# Patient Record
Sex: Female | Born: 1975
Health system: Midwestern US, Community
[De-identification: ages and names within clinical notes are randomized; demographics above are authoritative.]

## PROBLEM LIST (undated history)

## (undated) DIAGNOSIS — F329 Major depressive disorder, single episode, unspecified: Secondary | ICD-10-CM

## (undated) DIAGNOSIS — F4310 Post-traumatic stress disorder, unspecified: Secondary | ICD-10-CM

## (undated) DIAGNOSIS — M797 Fibromyalgia: Secondary | ICD-10-CM

## (undated) DIAGNOSIS — F319 Bipolar disorder, unspecified: Secondary | ICD-10-CM

## (undated) DIAGNOSIS — G43909 Migraine, unspecified, not intractable, without status migrainosus: Secondary | ICD-10-CM

## (undated) DIAGNOSIS — F32A Depression, unspecified: Secondary | ICD-10-CM

## (undated) DIAGNOSIS — E079 Disorder of thyroid, unspecified: Secondary | ICD-10-CM

## (undated) HISTORY — PX: BRAIN SURGERY: SHX531

## (undated) HISTORY — PX: TONSILLECTOMY: SUR1361

---

## 1997-10-25 ENCOUNTER — Emergency Department (HOSPITAL_COMMUNITY): Admission: EM | Admit: 1997-10-25 | Discharge: 1997-10-25 | Payer: Self-pay | Admitting: Emergency Medicine

## 2005-07-22 ENCOUNTER — Emergency Department (HOSPITAL_COMMUNITY): Admission: EM | Admit: 2005-07-22 | Discharge: 2005-07-22 | Payer: Self-pay | Admitting: Emergency Medicine

## 2006-02-12 ENCOUNTER — Emergency Department (HOSPITAL_COMMUNITY): Admission: EM | Admit: 2006-02-12 | Discharge: 2006-02-12 | Payer: Self-pay | Admitting: Emergency Medicine

## 2006-03-02 ENCOUNTER — Emergency Department (HOSPITAL_COMMUNITY): Admission: EM | Admit: 2006-03-02 | Discharge: 2006-03-02 | Payer: Self-pay | Admitting: Emergency Medicine

## 2006-12-30 ENCOUNTER — Emergency Department (HOSPITAL_COMMUNITY): Admission: EM | Admit: 2006-12-30 | Discharge: 2006-12-30 | Payer: Self-pay | Admitting: Emergency Medicine

## 2007-01-08 ENCOUNTER — Emergency Department (HOSPITAL_COMMUNITY): Admission: EM | Admit: 2007-01-08 | Discharge: 2007-01-08 | Payer: Self-pay | Admitting: Emergency Medicine

## 2007-01-12 ENCOUNTER — Emergency Department (HOSPITAL_COMMUNITY): Admission: EM | Admit: 2007-01-12 | Discharge: 2007-01-12 | Payer: Self-pay | Admitting: Emergency Medicine

## 2007-01-27 ENCOUNTER — Emergency Department (HOSPITAL_COMMUNITY): Admission: EM | Admit: 2007-01-27 | Discharge: 2007-01-27 | Payer: Self-pay | Admitting: Emergency Medicine

## 2007-07-01 ENCOUNTER — Emergency Department: Payer: Self-pay | Admitting: Emergency Medicine

## 2007-07-29 ENCOUNTER — Emergency Department (HOSPITAL_COMMUNITY): Admission: EM | Admit: 2007-07-29 | Discharge: 2007-07-29 | Payer: Self-pay | Admitting: Emergency Medicine

## 2007-08-16 ENCOUNTER — Encounter: Admission: RE | Admit: 2007-08-16 | Discharge: 2007-08-16 | Payer: Self-pay

## 2007-09-07 ENCOUNTER — Emergency Department (HOSPITAL_COMMUNITY): Admission: EM | Admit: 2007-09-07 | Discharge: 2007-09-07 | Payer: Self-pay | Admitting: Emergency Medicine

## 2008-01-01 ENCOUNTER — Emergency Department (HOSPITAL_COMMUNITY): Admission: EM | Admit: 2008-01-01 | Discharge: 2008-01-01 | Payer: Self-pay | Admitting: Emergency Medicine

## 2008-04-25 ENCOUNTER — Emergency Department (HOSPITAL_COMMUNITY): Admission: EM | Admit: 2008-04-25 | Discharge: 2008-04-25 | Payer: Self-pay | Admitting: Emergency Medicine

## 2008-06-20 ENCOUNTER — Emergency Department (HOSPITAL_COMMUNITY): Admission: EM | Admit: 2008-06-20 | Discharge: 2008-06-20 | Payer: Self-pay | Admitting: Emergency Medicine

## 2008-07-25 ENCOUNTER — Emergency Department (HOSPITAL_COMMUNITY): Admission: EM | Admit: 2008-07-25 | Discharge: 2008-07-25 | Payer: Self-pay | Admitting: Emergency Medicine

## 2008-09-21 ENCOUNTER — Emergency Department (HOSPITAL_COMMUNITY): Admission: EM | Admit: 2008-09-21 | Discharge: 2008-09-21 | Payer: Self-pay | Admitting: Emergency Medicine

## 2008-11-06 ENCOUNTER — Emergency Department (HOSPITAL_COMMUNITY): Admission: EM | Admit: 2008-11-06 | Discharge: 2008-11-06 | Payer: Self-pay | Admitting: Emergency Medicine

## 2009-06-28 ENCOUNTER — Emergency Department (HOSPITAL_COMMUNITY): Admission: EM | Admit: 2009-06-28 | Discharge: 2009-06-28 | Payer: Self-pay | Admitting: Emergency Medicine
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
## 2009-12-31 ENCOUNTER — Emergency Department (HOSPITAL_COMMUNITY): Admission: EM | Admit: 2009-12-31 | Discharge: 2009-12-31 | Payer: Self-pay | Admitting: Emergency Medicine

## 2010-03-27 ENCOUNTER — Emergency Department (HOSPITAL_COMMUNITY)
Admission: EM | Admit: 2010-03-27 | Discharge: 2010-03-27 | Disposition: A | Discharge status: Other Acute Inpatient Hospital | Payer: Self-pay | Source: Home / Self Care | Admitting: Emergency Medicine

## 2010-04-29 ENCOUNTER — Encounter: Payer: Self-pay | Admitting: Family Medicine

## 2010-06-18 LAB — DIFFERENTIAL
Basophils Absolute: 0 10*3/uL (ref 0.0–0.1)
Eosinophils Absolute: 0.1 10*3/uL (ref 0.0–0.7)
Lymphs Abs: 1.8 10*3/uL (ref 0.7–4.0)
Monocytes Absolute: 0.4 10*3/uL (ref 0.1–1.0)
Monocytes Relative: 5 % (ref 3–12)
Neutrophils Relative %: 72 % (ref 43–77)

## 2010-06-18 LAB — BASIC METABOLIC PANEL
BUN: 7 mg/dL (ref 6–23)
Chloride: 103 mEq/L (ref 96–112)
Creatinine, Ser: 0.88 mg/dL (ref 0.4–1.2)
GFR calc Af Amer: >60 mL/min (ref >60)
GFR calc non Af Amer: >60 mL/min (ref >60)
Glucose, Bld: 97 mg/dL (ref 70–99)
Potassium: 3.1 mEq/L — ABNORMAL LOW (ref 3.5–5.1)

## 2010-06-18 LAB — CBC
MCH: 31.9 pg (ref 26.0–34.0)
MCV: 88.1 fL (ref 78.0–100.0)
Platelets: 226 10*3/uL (ref 150–400)
RBC: 4.36 MIL/uL (ref 3.87–5.11)

## 2010-07-18 LAB — HEPATIC FUNCTION PANEL
ALT: 17 U/L (ref 0–35)
AST: 19 U/L (ref 0–37)
Albumin: 4.4 g/dL (ref 3.5–5.2)
Alkaline Phosphatase: 61 U/L (ref 39–117)
Bilirubin, Direct: 0.1 mg/dL (ref 0.0–0.3)
Total Bilirubin: 0.8 mg/dL (ref 0.3–1.2)

## 2010-07-18 LAB — DIFFERENTIAL
Lymphocytes Relative: 21 % (ref 12–46)
Lymphs Abs: 1.6 10*3/uL (ref 0.7–4.0)
Monocytes Absolute: 0.3 10*3/uL (ref 0.1–1.0)
Monocytes Relative: 4 % (ref 3–12)
Neutro Abs: 5.7 10*3/uL (ref 1.7–7.7)

## 2010-07-18 LAB — BASIC METABOLIC PANEL
CO2: 27 mEq/L (ref 19–32)
Calcium: 8.9 mg/dL (ref 8.4–10.5)
GFR calc Af Amer: >60 mL/min (ref >60)
GFR calc non Af Amer: >60 mL/min (ref >60)
Sodium: 136 mEq/L (ref 135–145)

## 2010-07-18 LAB — CBC
Hemoglobin: 14.8 g/dL (ref 12.0–15.0)
MCHC: 35.4 g/dL (ref 30.0–36.0)
RBC: 4.61 MIL/uL (ref 3.87–5.11)
WBC: 7.6 10*3/uL (ref 4.0–10.5)

## 2010-07-19 LAB — CBC
MCHC: 34 g/dL (ref 30.0–36.0)
MCV: 92.6 fL (ref 78.0–100.0)
RDW: 12.9 % (ref 11.5–15.5)

## 2010-07-19 LAB — DIFFERENTIAL
Eosinophils Absolute: 0.2 10*3/uL (ref 0.0–0.7)
Lymphocytes Relative: 33 % (ref 12–46)
Lymphs Abs: 2.6 10*3/uL (ref 0.7–4.0)
Monocytes Relative: 6 % (ref 3–12)
Neutrophils Relative %: 59 % (ref 43–77)

## 2010-07-19 LAB — COMPREHENSIVE METABOLIC PANEL
ALT: 49 U/L — ABNORMAL HIGH (ref 0–35)
AST: 35 U/L (ref 0–37)
Calcium: 8.9 mg/dL (ref 8.4–10.5)
Creatinine, Ser: 0.65 mg/dL (ref 0.4–1.2)
GFR calc Af Amer: >60 mL/min (ref >60)
GFR calc non Af Amer: >60 mL/min (ref >60)
Glucose, Bld: 93 mg/dL (ref 70–99)
Sodium: 137 mEq/L (ref 135–145)
Total Protein: 7.2 g/dL (ref 6.0–8.3)

## 2010-07-19 LAB — MAGNESIUM: Magnesium: 2.1 mg/dL (ref 1.5–2.5)

## 2010-07-19 LAB — SEDIMENTATION RATE: Sed Rate: 12 mm/hr (ref 0–22)

## 2011-04-22 ENCOUNTER — Other Ambulatory Visit: Payer: Self-pay

## 2011-04-22 ENCOUNTER — Emergency Department (HOSPITAL_COMMUNITY): Payer: Self-pay

## 2011-04-22 ENCOUNTER — Encounter (HOSPITAL_COMMUNITY): Payer: Self-pay | Admitting: *Deleted

## 2011-04-22 ENCOUNTER — Emergency Department (HOSPITAL_COMMUNITY)
Admission: EM | Admit: 2011-04-22 | Discharge: 2011-04-22 | Disposition: A | Discharge status: Home / Self Care | Payer: Self-pay | Attending: Emergency Medicine | Admitting: Emergency Medicine

## 2011-04-22 DIAGNOSIS — F419 Anxiety disorder, unspecified: Secondary | ICD-10-CM

## 2011-04-22 DIAGNOSIS — I10 Essential (primary) hypertension: Secondary | ICD-10-CM | POA: Insufficient documentation

## 2011-04-22 DIAGNOSIS — R0602 Shortness of breath: Secondary | ICD-10-CM | POA: Insufficient documentation

## 2011-04-22 DIAGNOSIS — E039 Hypothyroidism, unspecified: Secondary | ICD-10-CM | POA: Insufficient documentation

## 2011-04-22 DIAGNOSIS — I498 Other specified cardiac arrhythmias: Secondary | ICD-10-CM | POA: Insufficient documentation

## 2011-04-22 DIAGNOSIS — R5381 Other malaise: Secondary | ICD-10-CM | POA: Insufficient documentation

## 2011-04-22 DIAGNOSIS — F411 Generalized anxiety disorder: Secondary | ICD-10-CM | POA: Insufficient documentation

## 2011-04-22 DIAGNOSIS — IMO0001 Reserved for inherently not codable concepts without codable children: Secondary | ICD-10-CM | POA: Insufficient documentation

## 2011-04-22 DIAGNOSIS — Z79899 Other long term (current) drug therapy: Secondary | ICD-10-CM | POA: Insufficient documentation

## 2011-04-22 DIAGNOSIS — R209 Unspecified disturbances of skin sensation: Secondary | ICD-10-CM | POA: Insufficient documentation

## 2011-04-22 HISTORY — DX: Disorder of thyroid, unspecified: E07.9

## 2011-04-22 HISTORY — DX: Fibromyalgia: M79.7

## 2011-04-22 LAB — COMPREHENSIVE METABOLIC PANEL
Albumin: 4.4 g/dL (ref 3.5–5.2)
Alkaline Phosphatase: 79 U/L (ref 39–117)
BUN: 8 mg/dL (ref 6–23)
CO2: 28 mEq/L (ref 19–32)
Chloride: 100 mEq/L (ref 96–112)
Creatinine, Ser: 0.79 mg/dL (ref 0.50–1.10)
GFR calc Af Amer: >90 mL/min (ref >90)
GFR calc non Af Amer: >90 mL/min (ref >90)
Glucose, Bld: 104 mg/dL — ABNORMAL HIGH (ref 70–99)
Potassium: 3.3 mEq/L — ABNORMAL LOW (ref 3.5–5.1)
Total Bilirubin: 0.7 mg/dL (ref 0.3–1.2)

## 2011-04-22 LAB — DIFFERENTIAL
Lymphocytes Relative: 32 % (ref 12–46)
Lymphs Abs: 2.4 10*3/uL (ref 0.7–4.0)
Monocytes Absolute: 0.5 10*3/uL (ref 0.1–1.0)
Monocytes Relative: 7 % (ref 3–12)
Neutro Abs: 4.5 10*3/uL (ref 1.7–7.7)
Neutrophils Relative %: 60 % (ref 43–77)

## 2011-04-22 LAB — CBC
HCT: 42.7 % (ref 36.0–46.0)
Hemoglobin: 14.6 g/dL (ref 12.0–15.0)
MCHC: 34.2 g/dL (ref 30.0–36.0)
RBC: 4.72 MIL/uL (ref 3.87–5.11)

## 2011-04-22 LAB — POCT I-STAT TROPONIN I: Troponin i, poc: 0 ng/mL (ref 0.00–0.08)

## 2011-04-22 MED ORDER — HYDROCODONE-ACETAMINOPHEN 5-325 MG PO TABS: 1.0000 | ORAL_TABLET | Freq: Four times a day (QID) | ORAL | Status: AC | PRN

## 2011-04-22 MED ORDER — METOPROLOL TARTRATE 1 MG/ML IV SOLN
5.0000 mg | Freq: Once | INTRAVENOUS | Status: AC
  Administered 2011-04-22: 5 mg via INTRAVENOUS
  Filled 2011-04-22: qty 5

## 2011-04-22 MED ORDER — SODIUM CHLORIDE 0.9 % IV SOLN
Freq: Once | INTRAVENOUS | Status: AC
  Administered 2011-04-22: 18:00:00 via INTRAVENOUS

## 2011-04-22 MED ORDER — ALPRAZOLAM 0.5 MG PO TABS: 0.5000 mg | ORAL_TABLET | Freq: Three times a day (TID) | ORAL | Status: AC | PRN

## 2011-04-22 MED ORDER — ATENOLOL 25 MG PO TABS: 25.0000 mg | ORAL_TABLET | Freq: Every day | ORAL | Status: DC

## 2011-04-22 MED ORDER — TRAMADOL HCL 50 MG PO TABS: 50.0000 mg | ORAL_TABLET | Freq: Four times a day (QID) | ORAL | Status: AC | PRN

## 2011-04-22 NOTE — ED Notes (Addendum)
C/o all over body aches-reports hx of fibromyalgia; c/o numbness to hands bilaterally x 2 weeks-+3 bilateral radial pulses palpated; c/o elevated BP-states took a lisinopril of her grandmother's today and has felt short of breath since (in no distress).

## 2011-04-22 NOTE — ED Notes (Signed)
Pt reporting pain level at about 5/10.  BP improved.  Pt denies any additional needs at present time.  Family at bedside.

## 2011-04-22 NOTE — ED Provider Notes (Signed)
History     CSN: [id number]  Arrival date & time 04/22/11  1554   First MD Initiated Contact with Patient 04/22/11 1620      Chief Complaint  Patient presents with  . Generalized Body Aches  . Numbness    to hands x 14 days  . Shortness of Breath    states took a lisinopril today and has felt sob since    (Consider location/radiation/quality/duration/timing/severity/associated sxs/prior treatment) Patient is a 36 y.o. female presenting with weakness. The history is provided by the patient (patient complains of weakness and numbness in hands while dizziness patient states that her blood pressure has been elevated. She was on medicine at one time but has not seen a physician for quite some time). No language interpreter was used.  Weakness The primary symptoms include paresthesias. Primary symptoms do not include headaches, syncope, seizures, visual change, speech change, memory loss or fever. The symptoms began yesterday. The symptoms are improving. The neurological symptoms are multifocal. The symptoms occurred on exertion.  Additional symptoms include weakness. Additional symptoms do not include neck stiffness, pain or hallucinations. Medical issues do not include seizures or cerebral vascular accident. Workup history does not include MRI.    Past Medical History  Diagnosis Date  . Fibromyalgia   . Thyroid disease     hypothyroidism    History reviewed. No pertinent past surgical history.  No family history on file.  History  Substance Use Topics  . Smoking status: Never Smoker   . Smokeless tobacco: Not on file  . Alcohol Use: No    OB History    Grav Para Term Preterm Abortions TAB SAB Ect Mult Living                  Review of Systems  Constitutional: Negative for fever and fatigue.  HENT: Negative for congestion, neck stiffness, sinus pressure and ear discharge.   Eyes: Negative for discharge.  Respiratory: Positive for shortness of breath. Negative for  cough.   Cardiovascular: Negative for chest pain and syncope.  Gastrointestinal: Negative for abdominal pain and diarrhea.  Genitourinary: Negative for frequency and hematuria.  Musculoskeletal: Negative for back pain.  Skin: Negative for rash.  Neurological: Positive for weakness and paresthesias. Negative for speech change, seizures and headaches.  Hematological: Negative.   Psychiatric/Behavioral: Negative for hallucinations and memory loss.    Allergies  Demerol; Morphine and related; Penicillins; Prednisone; and Toradol  Home Medications   Current Outpatient Rx  Name Route Sig Dispense Refill  . VITAMIN D 1000 UNITS PO TABS Oral Take 1,000 Units by mouth daily.    . B-12 PO Oral Take 1 tablet by mouth daily.    . ADULT MULTIVITAMIN W/MINERALS CH Oral Take 1 tablet by mouth daily.    Marland Kitchen MENOPAUSE FORMULA PO Oral Take 1 tablet by mouth once.    Marland Kitchen FISH OIL 1000 MG PO CAPS Oral Take 1 capsule by mouth daily.    Marland Kitchen ALPRAZOLAM 0.5 MG PO TABS Oral Take 1 tablet (0.5 mg total) by mouth 3 (three) times daily as needed for sleep. 30 tablet 0  . ATENOLOL 25 MG PO TABS Oral Take 1 tablet (25 mg total) by mouth daily. 30 tablet 1  . TRAMADOL HCL 50 MG PO TABS Oral Take 1 tablet (50 mg total) by mouth every 6 (six) hours as needed for pain. 60 tablet 0    BP 138/80  Pulse 92  Temp(Src) 98.1 F (36.7 C) (Oral)  Resp 16  Ht 5\' 5"  (1.651 m)  Wt 225 lb (102.059 kg)  BMI 37.44 kg/m2  SpO2 99%  LMP 04/15/2011  Physical Exam  Constitutional: She is oriented to person, place, and time. She appears well-developed.  HENT:  Head: Normocephalic and atraumatic.  Eyes: Conjunctivae and EOM are normal. No scleral icterus.  Neck: Neck supple. No thyromegaly present.  Cardiovascular: Regular rhythm.  Exam reveals no gallop and no friction rub.   No murmur heard.      Sinus tach  Pulmonary/Chest: No stridor. She has no wheezes. She has no rales. She exhibits no tenderness.  Abdominal: She  exhibits no distension. There is no tenderness. There is no rebound.  Musculoskeletal: Normal range of motion. She exhibits no edema.  Lymphadenopathy:    She has no cervical adenopathy.  Neurological: She is oriented to person, place, and time. Coordination normal.  Skin: No rash noted. No erythema.  Psychiatric: She has a normal mood and affect. Her behavior is normal.    ED Course  Procedures (including critical care time)  Labs Reviewed  COMPREHENSIVE METABOLIC PANEL - Abnormal; Notable for the following:    Potassium 3.3 (*)    Glucose, Bld 104 (*)    All other components within normal limits  CBC  DIFFERENTIAL  POCT I-STAT TROPONIN I  I-STAT TROPONIN I   Dg Chest 2 View  04/22/2011  *RADIOLOGY REPORT*  Clinical Data: Diffuse body aches.  Numbness in both hands. Hypertension.  CHEST - 2 VIEW  Comparison: Thoracic radiographs dated 11/06/2008  Findings: The heart size and vascularity are normal and the lungs are clear.  No significant osseous abnormality.  No effusions.Old well healed fracture of the mid left clavicle.  IMPRESSION: No acute abnormalities.  Original Report Authenticated By: Gwynn Burly, M.D.     1. High blood pressure   2. Anxiety      Date: 04/22/2011  Rate: 112  Rhythm: sinus tachycardia  QRS Axis: normal  Intervals: normal  ST/T Wave abnormalities: nonspecific ST changes  Conduction Disutrbances:none  Narrative Interpretation:   Old EKG Reviewed: none available    MDM  Anxiety,  htn        Benny Lennert, MD 04/22/11 1941

## 2011-04-22 NOTE — ED Notes (Signed)
Patient transported to X-ray 

## 2011-09-12 ENCOUNTER — Emergency Department (HOSPITAL_COMMUNITY)
Admission: EM | Admit: 2011-09-12 | Discharge: 2011-09-12 | Disposition: A | Discharge status: Home / Self Care | Payer: Self-pay | Attending: Emergency Medicine | Admitting: Emergency Medicine

## 2011-09-12 ENCOUNTER — Encounter (HOSPITAL_COMMUNITY): Payer: Self-pay | Admitting: *Deleted

## 2011-09-12 DIAGNOSIS — E039 Hypothyroidism, unspecified: Secondary | ICD-10-CM | POA: Insufficient documentation

## 2011-09-12 DIAGNOSIS — M797 Fibromyalgia: Secondary | ICD-10-CM

## 2011-09-12 DIAGNOSIS — R21 Rash and other nonspecific skin eruption: Secondary | ICD-10-CM | POA: Insufficient documentation

## 2011-09-12 DIAGNOSIS — IMO0001 Reserved for inherently not codable concepts without codable children: Secondary | ICD-10-CM | POA: Insufficient documentation

## 2011-09-12 LAB — CBC
HCT: 39.7 % (ref 36.0–46.0)
Hemoglobin: 13.4 g/dL (ref 12.0–15.0)
MCHC: 33.8 g/dL (ref 30.0–36.0)
MCV: 91.1 fL (ref 78.0–100.0)
RDW: 13.2 % (ref 11.5–15.5)

## 2011-09-12 LAB — COMPREHENSIVE METABOLIC PANEL
AST: 30 U/L (ref 0–37)
Albumin: 3.9 g/dL (ref 3.5–5.2)
BUN: 6 mg/dL (ref 6–23)
CO2: 29 mEq/L (ref 19–32)
Calcium: 8.9 mg/dL (ref 8.4–10.5)
Chloride: 100 mEq/L (ref 96–112)
Creatinine, Ser: 0.87 mg/dL (ref 0.50–1.10)
GFR calc non Af Amer: 85 mL/min — ABNORMAL LOW (ref >90)
Total Bilirubin: 0.2 mg/dL — ABNORMAL LOW (ref 0.3–1.2)

## 2011-09-12 LAB — DIFFERENTIAL
Basophils Absolute: 0 10*3/uL (ref 0.0–0.1)
Basophils Relative: 0 % (ref 0–1)
Eosinophils Relative: 5 % (ref 0–5)
Monocytes Absolute: 0.4 10*3/uL (ref 0.1–1.0)
Monocytes Relative: 8 % (ref 3–12)

## 2011-09-12 MED ORDER — OXYCODONE-ACETAMINOPHEN 10-325 MG PO TABS: 1.0000 | ORAL_TABLET | Freq: Four times a day (QID) | ORAL | Status: AC | PRN

## 2011-09-12 MED ORDER — HYDROMORPHONE HCL PF 1 MG/ML IJ SOLN
1.0000 mg | Freq: Once | INTRAMUSCULAR | Status: AC
  Administered 2011-09-12: 1 mg via INTRAMUSCULAR
  Filled 2011-09-12: qty 1

## 2011-09-12 NOTE — ED Provider Notes (Addendum)
History   This chart was scribed for Benny Lennert, MD by Charolett Bumpers . The patient was seen in room STRE8/STRE8.    CSN: [id number]  Arrival date & time 09/12/11  1746   First MD Initiated Contact with Patient 09/12/11 1821      Chief Complaint  Patient presents with  . multiple symptoms     (Consider location/radiation/quality/duration/timing/severity/associated sxs/prior treatment) HPI Comments: Patient states that states that she has a h/o fibromyalgia since 2008. Patient states that last night, she had a flair up of her fibromyalgia. Patient states that her legs, hands, arms and back hurt. Patient states that her current pain is similar to the fibromyalgia pain she had when she was first diagnosed. Patient rates the pain as a 7-8/10 last night, and a 10/10 today. Patient also reports associated swelling of lower legs bilaterally. Patient states that she was unable to ambulate last night. Patient states that she took a Motrin 800 and Hydrocodone with no relief. Patient also reports a itchy, rash on her forearms and lower legs that started yesterday. Patient states that she works at a [employer], and thinks she might be having a reaction. Patient states that she hasnt' been followed by a PCP in a year.    Patient is a 36 y.o. female presenting with musculoskeletal pain and rash. The history is provided by the patient.  Muscle Pain This is a chronic problem. The current episode started yesterday. The problem occurs constantly. The problem has not changed since onset.Pertinent negatives include no abdominal pain and no shortness of breath. The symptoms are aggravated by nothing. The symptoms are relieved by nothing. She has tried acetaminophen for the symptoms.  Rash  This is a new problem. The current episode started yesterday. The problem has been gradually worsening. The problem is associated with plant contact. There has been no fever. The rash is present on the left lower  leg, right lower leg, right arm and left arm. The patient is experiencing no pain. Associated symptoms include itching. She has tried anti-itch cream for the symptoms. The treatment provided no relief.    Past Medical History  Diagnosis Date  . Fibromyalgia   . Thyroid disease     hypothyroidism  . Fibromyalgia     History reviewed. No pertinent past surgical history.  No family history on file.  History  Substance Use Topics  . Smoking status: Never Smoker   . Smokeless tobacco: Not on file  . Alcohol Use: No    OB History    Grav Para Term Preterm Abortions TAB SAB Ect Mult Living                  Review of Systems  Constitutional: Negative for fever and chills.  Respiratory: Negative for shortness of breath.   Cardiovascular: Positive for leg swelling.  Gastrointestinal: Negative for nausea, vomiting and abdominal pain.  Musculoskeletal: Positive for myalgias.  Skin: Positive for itching and rash.  Neurological: Negative for weakness.  All other systems reviewed and are negative.    Allergies  Demerol; Ketorolac tromethamine; Morphine and related; Penicillins; and Prednisone  Home Medications   Current Outpatient Rx  Name Route Sig Dispense Refill  . HYDROCODONE-ACETAMINOPHEN 10-325 MG PO TABS Oral Take 1 tablet by mouth daily as needed. For pain    . IBUPROFEN 800 MG PO TABS Oral Take 800 mg by mouth every 8 (eight) hours as needed. For pain    . NAPHAZOLINE HCL 0.012 %  OP SOLN Both Eyes Place 1 drop into both eyes 4 (four) times daily.      BP 145/94  Pulse 89  Temp(Src) 98.7 F (37.1 C) (Oral)  Resp 18  SpO2 99%  Physical Exam  Constitutional: She is oriented to person, place, and time. She appears well-developed.  HENT:  Head: Normocephalic and atraumatic.  Eyes: Conjunctivae and EOM are normal. No scleral icterus.  Neck: Neck supple. No thyromegaly present.  Cardiovascular: Normal rate and regular rhythm.  Exam reveals no gallop and no friction  rub.   No murmur heard. Pulmonary/Chest: No stridor. She has no wheezes. She has no rales. She exhibits no tenderness.  Abdominal: She exhibits no distension. There is no tenderness. There is no rebound.  Musculoskeletal: Normal range of motion. She exhibits no edema.       +1 edema of lower extremities.   Lymphadenopathy:    She has no cervical adenopathy.  Neurological: She is oriented to person, place, and time. Coordination normal.  Skin: Rash noted. Rash is maculopapular. No erythema.       Maculopapular rash to forearms and legs.   Psychiatric: She has a normal mood and affect. Her behavior is normal.    ED Course  Procedures (including critical care time)  DIAGNOSTIC STUDIES: Oxygen Saturation is 99% on room air, normal by my interpretation.    COORDINATION OF CARE:  1832: Discussed planned course of treatment with the patient, who is agreeable at this time.  1845: Medication Orders: Hydromorphone (Dilaudid) injection 1 mg-once 1945: Recheck: Informed patient of lab results and planned d/c. Patient was agreeable.   Labs Reviewed  COMPREHENSIVE METABOLIC PANEL - Abnormal; Notable for the following:    Total Bilirubin 0.2 (*)    GFR calc non Af Amer 85 (*)    All other components within normal limits  CBC  DIFFERENTIAL   No results found.   No diagnosis found.    MDM   The chart was scribed for me under my direct supervision.  I personally performed the history, physical, and medical decision making and all procedures in the evaluation of this patient.Benny Lennert, MD 09/12/11 1951  Benny Lennert, MD 09/12/11 [phone]

## 2011-09-12 NOTE — ED Notes (Signed)
The pt pain is better

## 2011-09-12 NOTE — ED Notes (Signed)
Dx with fibromyalgia 2008.  She has a job moving crates.  She has soreness all over and she has a rash since yesterday.  She thinks her rt elbow is dislocated.

## 2011-09-12 NOTE — Discharge Instructions (Signed)
Follow up if not improving

## 2012-01-28 ENCOUNTER — Encounter (HOSPITAL_COMMUNITY): Payer: Self-pay

## 2012-01-28 ENCOUNTER — Emergency Department (HOSPITAL_COMMUNITY)
Admission: EM | Admit: 2012-01-28 | Discharge: 2012-01-28 | Disposition: A | Discharge status: Home / Self Care | Payer: Self-pay | Attending: Emergency Medicine | Admitting: Emergency Medicine

## 2012-01-28 DIAGNOSIS — Z79899 Other long term (current) drug therapy: Secondary | ICD-10-CM | POA: Insufficient documentation

## 2012-01-28 DIAGNOSIS — Z792 Long term (current) use of antibiotics: Secondary | ICD-10-CM | POA: Insufficient documentation

## 2012-01-28 DIAGNOSIS — E039 Hypothyroidism, unspecified: Secondary | ICD-10-CM | POA: Insufficient documentation

## 2012-01-28 DIAGNOSIS — K047 Periapical abscess without sinus: Secondary | ICD-10-CM | POA: Insufficient documentation

## 2012-01-28 MED ORDER — OXYCODONE-ACETAMINOPHEN 5-325 MG PO TABS: 1.0000 | ORAL_TABLET | Freq: Once | ORAL | Status: DC

## 2012-01-28 MED ORDER — CLINDAMYCIN HCL 150 MG PO CAPS: 150.0000 mg | ORAL_CAPSULE | Freq: Three times a day (TID) | ORAL | Status: DC

## 2012-01-28 MED ORDER — OXYCODONE-ACETAMINOPHEN 5-325 MG PO TABS
1.0000 | ORAL_TABLET | Freq: Once | ORAL | Status: AC
  Administered 2012-01-28: 1 via ORAL
  Filled 2012-01-28: qty 1

## 2012-01-28 MED ORDER — CLINDAMYCIN HCL 150 MG PO CAPS
150.0000 mg | ORAL_CAPSULE | Freq: Once | ORAL | Status: AC
  Administered 2012-01-28: 150 mg via ORAL
  Filled 2012-01-28: qty 1

## 2012-01-28 MED ORDER — IBUPROFEN 800 MG PO TABS
800.0000 mg | ORAL_TABLET | Freq: Once | ORAL | Status: AC
  Administered 2012-01-28: 800 mg via ORAL

## 2012-01-28 NOTE — ED Notes (Signed)
Patient  is very distraught over her pain, motrin given as ordered, advised she would be evaluated soon.

## 2012-01-28 NOTE — ED Notes (Signed)
Pt reports has had pain from wisdom teeth coming in crooked.  Reports doesn't have insurance and can't afford to see a dentist.  Pt says r side is worse than left.  Reports bp has been elevated due to pain.

## 2012-01-28 NOTE — ED Notes (Signed)
Patient out a desk stating motrin did not help, requesting to see a doctor now, advised a provider would be with her as soon as possible, order to give one percocet per  Burgess Amor

## 2012-01-29 NOTE — ED Provider Notes (Signed)
History     CSN: [id number]  Arrival date & time 01/28/12  1035   First MD Initiated Contact with Patient 01/28/12 1238      Chief Complaint  Patient presents with  . Dental Pain    (Consider location/radiation/quality/duration/timing/severity/associated sxs/prior treatment) HPI Comments: Sheila Horn  presents with a 7 day history of dental pain and gingival swelling.   The patient has her lower wisdom teeth coming in and they are pushing against her other teeth causing pain.  She has also had increased gingival swelling and drainage from around her right lower 2nd molar which she blames on loss of the filling in this tooth.  There has been no fevers,  Chills, nausea or vomiting, also no complaint of difficulty swallowing,  Although chewing makes pain worse.  The patient has tried ibuprofen without relief of symptoms.      The history is provided by the patient.    Past Medical History  Diagnosis Date  . Fibromyalgia   . Thyroid disease     hypothyroidism  . Fibromyalgia     Past Surgical History  Procedure Date  . Tonsillectomy   . Brain surgery     No family history on file.  History  Substance Use Topics  . Smoking status: Never Smoker   . Smokeless tobacco: Not on file  . Alcohol Use: No    OB History    Grav Para Term Preterm Abortions TAB SAB Ect Mult Living                  Review of Systems  Constitutional: Negative for fever.  HENT: Positive for dental problem. Negative for sore throat, facial swelling, neck pain and neck stiffness.   Respiratory: Negative for shortness of breath.     Allergies  Demerol; Ketorolac tromethamine; Morphine and related; Penicillins; and Prednisone  Home Medications   Current Outpatient Rx  Name Route Sig Dispense Refill  . CLINDAMYCIN HCL 150 MG PO CAPS Oral Take 1 capsule (150 mg total) by mouth 3 (three) times daily. 30 capsule 0  . HYDROCODONE-ACETAMINOPHEN 10-325 MG PO TABS Oral Take 1 tablet by mouth  daily as needed. For pain    . IBUPROFEN 800 MG PO TABS Oral Take 800 mg by mouth every 8 (eight) hours as needed. For pain    . NAPHAZOLINE HCL 0.012 % OP SOLN Both Eyes Place 1 drop into both eyes 4 (four) times daily.    . OXYCODONE-ACETAMINOPHEN 5-325 MG PO TABS Oral Take 1 tablet by mouth once. 20 tablet 0    BP 154/105  Pulse 103  Temp 98.5 F (36.9 C) (Oral)  Resp 20  Ht 5\' 7"  (1.702 m)  Wt 145 lb (65.772 kg)  BMI 22.71 kg/m2  SpO2 100%  LMP 12/29/2011  Physical Exam  Constitutional: She is oriented to person, place, and time. She appears well-developed and well-nourished. No distress.  HENT:  Head: Normocephalic and atraumatic.  Right Ear: Tympanic membrane and external ear normal.  Left Ear: Tympanic membrane and external ear normal.  Mouth/Throat: Oropharynx is clear and moist and mucous membranes are normal. No oral lesions. Dental abscesses present.         Possible early abscess at right lower 2nd molar.  No visible 3rd molar on this side.  Left 3rd molar with partial eruption.  Eyes: Conjunctivae normal are normal.  Neck: Normal range of motion. Neck supple.  Cardiovascular: Normal rate and normal heart sounds.   Pulmonary/Chest: Effort  normal.  Abdominal: She exhibits no distension.  Musculoskeletal: Normal range of motion.  Lymphadenopathy:    She has no cervical adenopathy.  Neurological: She is alert and oriented to person, place, and time.  Skin: Skin is warm and dry. No erythema.  Psychiatric: She has a normal mood and affect.    ED Course  Procedures (including critical care time)  Labs Reviewed - No data to display No results found.   1. Dental abscess    Ibuprofen given without relief of pain.  Oxycodone given with improvement in pain.   MDM  Pt prescribed cleocin, oxycodone.  Dental referrals given.        Burgess Amor, [place] 01/29/12 1733

## 2012-01-30 NOTE — ED Provider Notes (Signed)
Medical screening examination/treatment/procedure(s) were performed by non-physician practitioner and as supervising physician I was immediately available for consultation/collaboration.   Shelda Jakes, MD 01/30/12 [phone]

## 2012-10-07 ENCOUNTER — Emergency Department (HOSPITAL_COMMUNITY): Payer: Self-pay

## 2012-10-07 ENCOUNTER — Encounter (HOSPITAL_COMMUNITY): Payer: Self-pay | Admitting: *Deleted

## 2012-10-07 ENCOUNTER — Emergency Department (HOSPITAL_COMMUNITY)
Admission: EM | Admit: 2012-10-07 | Discharge: 2012-10-07 | Disposition: A | Discharge status: Home / Self Care | Payer: Self-pay | Attending: Emergency Medicine | Admitting: Emergency Medicine

## 2012-10-07 DIAGNOSIS — F329 Major depressive disorder, single episode, unspecified: Secondary | ICD-10-CM | POA: Insufficient documentation

## 2012-10-07 DIAGNOSIS — S0101XA Laceration without foreign body of scalp, initial encounter: Secondary | ICD-10-CM

## 2012-10-07 DIAGNOSIS — S0010XA Contusion of unspecified eyelid and periocular area, initial encounter: Secondary | ICD-10-CM | POA: Insufficient documentation

## 2012-10-07 DIAGNOSIS — Z3202 Encounter for pregnancy test, result negative: Secondary | ICD-10-CM | POA: Insufficient documentation

## 2012-10-07 DIAGNOSIS — Z880 Allergy status to penicillin: Secondary | ICD-10-CM | POA: Insufficient documentation

## 2012-10-07 DIAGNOSIS — S0100XA Unspecified open wound of scalp, initial encounter: Secondary | ICD-10-CM | POA: Insufficient documentation

## 2012-10-07 DIAGNOSIS — R45851 Suicidal ideations: Secondary | ICD-10-CM | POA: Insufficient documentation

## 2012-10-07 DIAGNOSIS — F4310 Post-traumatic stress disorder, unspecified: Secondary | ICD-10-CM | POA: Insufficient documentation

## 2012-10-07 DIAGNOSIS — S022XXB Fracture of nasal bones, initial encounter for open fracture: Secondary | ICD-10-CM | POA: Insufficient documentation

## 2012-10-07 DIAGNOSIS — Z862 Personal history of diseases of the blood and blood-forming organs and certain disorders involving the immune mechanism: Secondary | ICD-10-CM | POA: Insufficient documentation

## 2012-10-07 DIAGNOSIS — F3289 Other specified depressive episodes: Secondary | ICD-10-CM | POA: Insufficient documentation

## 2012-10-07 DIAGNOSIS — IMO0002 Reserved for concepts with insufficient information to code with codable children: Secondary | ICD-10-CM | POA: Insufficient documentation

## 2012-10-07 DIAGNOSIS — Z8639 Personal history of other endocrine, nutritional and metabolic disease: Secondary | ICD-10-CM | POA: Insufficient documentation

## 2012-10-07 DIAGNOSIS — Z9889 Other specified postprocedural states: Secondary | ICD-10-CM | POA: Insufficient documentation

## 2012-10-07 HISTORY — DX: Major depressive disorder, single episode, unspecified: F32.9

## 2012-10-07 HISTORY — DX: Depression, unspecified: F32.A

## 2012-10-07 HISTORY — DX: Post-traumatic stress disorder, unspecified: F43.10

## 2012-10-07 LAB — BASIC METABOLIC PANEL
BUN: 15 mg/dL (ref 6–23)
CO2: 28 mEq/L (ref 19–32)
Chloride: 99 mEq/L (ref 96–112)
Glucose, Bld: 102 mg/dL — ABNORMAL HIGH (ref 70–99)
Potassium: 4.6 mEq/L (ref 3.5–5.1)
Sodium: 137 mEq/L (ref 135–145)

## 2012-10-07 LAB — RAPID URINE DRUG SCREEN, HOSP PERFORMED
Amphetamines: NOT DETECTED
Barbiturates: NOT DETECTED
Benzodiazepines: NOT DETECTED
Cocaine: NOT DETECTED
Opiates: NOT DETECTED
Tetrahydrocannabinol: POSITIVE — AB

## 2012-10-07 LAB — CBC WITH DIFFERENTIAL/PLATELET
Eosinophils Absolute: 0.1 10*3/uL (ref 0.0–0.7)
Hemoglobin: 14.1 g/dL (ref 12.0–15.0)
Lymphocytes Relative: 25 % (ref 12–46)
Lymphs Abs: 2.3 10*3/uL (ref 0.7–4.0)
MCH: 27.5 pg (ref 26.0–34.0)
Monocytes Relative: 4 % (ref 3–12)
Neutrophils Relative %: 70 % (ref 43–77)
Platelets: 216 10*3/uL (ref 150–400)
RBC: 5.12 MIL/uL — ABNORMAL HIGH (ref 3.87–5.11)
WBC: 9.2 10*3/uL (ref 4.0–10.5)

## 2012-10-07 MED ORDER — OXYCODONE-ACETAMINOPHEN 5-325 MG PO TABS
1.0000 | ORAL_TABLET | Freq: Four times a day (QID) | ORAL | Status: DC | PRN
  Administered 2012-10-07: 1 via ORAL
  Filled 2012-10-07: qty 1

## 2012-10-07 MED ORDER — NICOTINE 21 MG/24HR TD PT24: 21.0000 mg | MEDICATED_PATCH | Freq: Every day | TRANSDERMAL | Status: DC

## 2012-10-07 MED ORDER — ZOLPIDEM TARTRATE 5 MG PO TABS: 10.0000 mg | ORAL_TABLET | Freq: Every evening | ORAL | Status: DC | PRN

## 2012-10-07 MED ORDER — SULFAMETHOXAZOLE-TRIMETHOPRIM 800-160 MG PO TABS: 1.0000 | ORAL_TABLET | Freq: Two times a day (BID) | ORAL | Status: DC

## 2012-10-07 MED ORDER — ACETAMINOPHEN 325 MG PO TABS: 650.0000 mg | ORAL_TABLET | ORAL | Status: DC | PRN

## 2012-10-07 MED ORDER — ONDANSETRON HCL 4 MG PO TABS: 4.0000 mg | ORAL_TABLET | Freq: Three times a day (TID) | ORAL | Status: DC | PRN

## 2012-10-07 MED ORDER — CITALOPRAM HYDROBROMIDE 20 MG PO TABS: 20.0000 mg | ORAL_TABLET | Freq: Every day | ORAL | Status: DC

## 2012-10-07 MED ORDER — LORAZEPAM 1 MG PO TABS: 1.0000 mg | ORAL_TABLET | Freq: Three times a day (TID) | ORAL | Status: DC | PRN

## 2012-10-07 MED ORDER — OXYCODONE-ACETAMINOPHEN 5-325 MG PO TABS: 1.0000 | ORAL_TABLET | ORAL | Status: DC | PRN

## 2012-10-07 MED ORDER — ALPRAZOLAM 0.5 MG PO TABS: 0.5000 mg | ORAL_TABLET | Freq: Every evening | ORAL | Status: DC | PRN

## 2012-10-07 MED ORDER — ALUM & MAG HYDROXIDE-SIMETH 200-200-20 MG/5ML PO SUSP: 30.0000 mL | ORAL | Status: DC | PRN

## 2012-10-07 MED ORDER — CLINDAMYCIN HCL 150 MG PO CAPS
300.0000 mg | ORAL_CAPSULE | Freq: Three times a day (TID) | ORAL | Status: DC
  Administered 2012-10-07: 300 mg via ORAL
  Filled 2012-10-07: qty 1

## 2012-10-07 MED ORDER — HYDROCODONE-ACETAMINOPHEN 5-325 MG PO TABS
1.0000 | ORAL_TABLET | Freq: Four times a day (QID) | ORAL | Status: DC | PRN
  Administered 2012-10-07: 1 via ORAL
  Filled 2012-10-07: qty 1

## 2012-10-07 MED ORDER — IBUPROFEN 400 MG PO TABS: 600.0000 mg | ORAL_TABLET | Freq: Three times a day (TID) | ORAL | Status: DC | PRN

## 2012-10-07 NOTE — [facility] Counselor (Signed)
THE PATIENT HAS BECOME IMPATIENT AND IS THREATENING TO LEAVE AMA. SHE DOES NOT WANT TO BE SEEN FOR TELE-PSYCH.  sHE IS NOW ARGUMENTATIVE AND DEMANDING.
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
## 2012-10-07 NOTE — ED Notes (Signed)
Pt c/o severe head and nose pain. Pt vital signs were obtained. Pt was hypertensive. Obtained bp in both arms and pt remained hypertensive. NAD noted. RN made aware of vital signs.
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
## 2012-10-07 NOTE — ED Notes (Signed)
Pt becoming impatient, wanting a Rx for pain and wanting to leave.  Per EDP , telepysch consult needs to be completed first.  Pt became confrontational, stating, "I'm not going to hurt myself, I want to go home and lay down."  Comfort measures offered to pt.  Eval process explained.  Pt still agitated.  edp notified, tommy with ACT filled out IVC papers.  PT now involuntary.
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
## 2012-10-07 NOTE — ED Provider Notes (Signed)
  Physical Exam  BP 144/108  Pulse 94  Temp(Src) 98.6 F (37 C) (Oral)  Resp 18  Ht 5\' 6"  (1.676 m)  Wt 200 lb (90.719 kg)  BMI 32.3 kg/m2  SpO2 100%  LMP 09/20/2012  Physical Exam  ED Course  Procedures  MDM Patient accepted at change of shift, psychiatric consult has been performed and psychiatrist has overturned involuntary commitment. I have reexamined the patient and at this time she states that her pain is improved, she is not suicidal, she is amenable to outpatient treatment for her underlying psychiatric illnesses.  Citalopram 20 mg by mouth in the morning Xanax as prescribed by prior physician Short course of pain medication for nasal fracture. Antibiotic for open nasal fracture prophylaxis.      Vida Roller, MD 10/07/12 [phone]

## 2012-10-07 NOTE — ED Notes (Signed)
Ice pack given to pt for hand and nose.

## 2012-10-07 NOTE — ED Notes (Signed)
Pt called me into room, asking for additional  pain medication. I told her I would let the EDP know but I wasn't counting on her ordering anything else because once she is at [facility], she will not be given narcotics. Pt then stated," I am not suicidal. My mother told me to say that because she has been through this process before." Explained to her to be totally honest with telepsych doctor during the assessment and we would go from there.

## 2012-10-08 ENCOUNTER — Telehealth (HOSPITAL_COMMUNITY): Payer: Self-pay | Admitting: Emergency Medicine

## 2013-04-19 ENCOUNTER — Emergency Department (HOSPITAL_COMMUNITY)
Admission: EM | Admit: 2013-04-19 | Discharge: 2013-04-19 | Disposition: A | Discharge status: Home / Self Care | Payer: BC Managed Care – PPO | Attending: Emergency Medicine | Admitting: Emergency Medicine

## 2013-04-19 ENCOUNTER — Encounter (HOSPITAL_COMMUNITY): Payer: Self-pay | Admitting: Emergency Medicine

## 2013-04-19 DIAGNOSIS — IMO0001 Reserved for inherently not codable concepts without codable children: Secondary | ICD-10-CM | POA: Insufficient documentation

## 2013-04-19 DIAGNOSIS — Y9389 Activity, other specified: Secondary | ICD-10-CM | POA: Insufficient documentation

## 2013-04-19 DIAGNOSIS — IMO0002 Reserved for concepts with insufficient information to code with codable children: Secondary | ICD-10-CM | POA: Insufficient documentation

## 2013-04-19 DIAGNOSIS — M797 Fibromyalgia: Secondary | ICD-10-CM

## 2013-04-19 DIAGNOSIS — F3289 Other specified depressive episodes: Secondary | ICD-10-CM | POA: Insufficient documentation

## 2013-04-19 DIAGNOSIS — Z862 Personal history of diseases of the blood and blood-forming organs and certain disorders involving the immune mechanism: Secondary | ICD-10-CM | POA: Insufficient documentation

## 2013-04-19 DIAGNOSIS — Z880 Allergy status to penicillin: Secondary | ICD-10-CM | POA: Insufficient documentation

## 2013-04-19 DIAGNOSIS — F329 Major depressive disorder, single episode, unspecified: Secondary | ICD-10-CM | POA: Insufficient documentation

## 2013-04-19 DIAGNOSIS — Y929 Unspecified place or not applicable: Secondary | ICD-10-CM | POA: Insufficient documentation

## 2013-04-19 DIAGNOSIS — M25561 Pain in right knee: Secondary | ICD-10-CM

## 2013-04-19 DIAGNOSIS — S8990XA Unspecified injury of unspecified lower leg, initial encounter: Secondary | ICD-10-CM | POA: Insufficient documentation

## 2013-04-19 DIAGNOSIS — S99919A Unspecified injury of unspecified ankle, initial encounter: Principal | ICD-10-CM

## 2013-04-19 DIAGNOSIS — Z8639 Personal history of other endocrine, nutritional and metabolic disease: Secondary | ICD-10-CM | POA: Insufficient documentation

## 2013-04-19 DIAGNOSIS — W108XXA Fall (on) (from) other stairs and steps, initial encounter: Secondary | ICD-10-CM | POA: Insufficient documentation

## 2013-04-19 DIAGNOSIS — Z79899 Other long term (current) drug therapy: Secondary | ICD-10-CM | POA: Insufficient documentation

## 2013-04-19 DIAGNOSIS — M25562 Pain in left knee: Secondary | ICD-10-CM

## 2013-04-19 DIAGNOSIS — S99929A Unspecified injury of unspecified foot, initial encounter: Principal | ICD-10-CM

## 2013-04-19 DIAGNOSIS — F4310 Post-traumatic stress disorder, unspecified: Secondary | ICD-10-CM | POA: Insufficient documentation

## 2013-04-19 MED ORDER — HYDROCODONE-ACETAMINOPHEN 5-325 MG PO TABS: 1.0000 | ORAL_TABLET | ORAL | Status: DC | PRN

## 2013-04-19 MED ORDER — MELOXICAM 7.5 MG PO TABS: 7.5000 mg | ORAL_TABLET | Freq: Every day | ORAL | Status: DC

## 2013-04-19 MED ORDER — DEXAMETHASONE 6 MG PO TABS: ORAL_TABLET | ORAL | Status: DC

## 2013-04-19 NOTE — ED Notes (Addendum)
Pt called and says she is allergic to Rx vicodan.  Rx written for percocet for pt to pick up by Loney LaurenceH Bryant

## 2013-04-19 NOTE — ED Notes (Signed)
Pt ambulated  To BR without problem

## 2013-04-19 NOTE — ED Provider Notes (Signed)
CSN: [id number]     Arrival date & time 04/19/13  1035 History   First MD Initiated Contact with Patient 04/19/13 1152     Chief Complaint  Patient presents with  . Knee Pain   (Consider location/radiation/quality/duration/timing/severity/associated sxs/prior Treatment) Patient is a 38 y.o. female presenting with knee pain. The history is provided by the patient.  Knee Pain Location:  Knee Time since incident:  2 days Lower extremity injury: both knees gave out while going up the stairs. Pt fell and injured the knees.   Affected location: both knees. Pain details:    Quality:  Aching, tingling and sharp   Severity:  Severe   Onset quality:  Sudden   Duration:  2 days   Timing:  Intermittent   Progression:  Worsening Chronicity: Acute on chronic. Dislocation: no   Prior injury to area:  No Relieved by:  Nothing Worsened by:  Bearing weight and flexion Ineffective treatments: aspirin. Associated symptoms: back pain, decreased ROM and fatigue   Associated symptoms: no neck pain   Risk factors: no frequent fractures   Risk factors comment:  Fibromyalgia   Past Medical History  Diagnosis Date  . Fibromyalgia   . Thyroid disease     hypothyroidism  . Fibromyalgia   . Depression   . PTSD (post-traumatic stress disorder)    Past Surgical History  Procedure Laterality Date  . Tonsillectomy    . Brain surgery     No family history on file. History  Substance Use Topics  . Smoking status: Never Smoker   . Smokeless tobacco: Not on file  . Alcohol Use: No   OB History   Grav Para Term Preterm Abortions TAB SAB Ect Mult Living                 Review of Systems  Constitutional: Positive for fatigue. Negative for activity change.       All ROS Neg except as noted in HPI  HENT: Negative for nosebleeds.   Eyes: Negative for photophobia and discharge.  Respiratory: Negative for cough, shortness of breath and wheezing.   Cardiovascular: Negative for chest pain and  palpitations.  Gastrointestinal: Negative for abdominal pain and blood in stool.  Genitourinary: Negative for dysuria, frequency and hematuria.  Musculoskeletal: Positive for arthralgias and back pain. Negative for neck pain.  Skin: Negative.   Neurological: Negative for dizziness, seizures and speech difficulty.  Psychiatric/Behavioral: Negative for hallucinations and confusion.       Depression    Allergies  Demerol; Ketorolac tromethamine; Morphine and related; Penicillins; and Prednisone  Home Medications   Current Outpatient Rx  Name  Route  Sig  Dispense  Refill  . ALPRAZolam (XANAX) 1 MG tablet   Oral   Take 1 mg by mouth 4 (four) times daily.         . Aspirin-Caffeine 845-65 MG PACK   Oral   Take 1 packet by mouth every 6 (six) hours as needed (pain).         . citalopram (CELEXA) 20 MG tablet   Oral   Take 1 tablet (20 mg total) by mouth daily.   30 tablet   1   . naproxen (NAPROSYN) 500 MG tablet   Oral   Take 500 mg by mouth daily as needed for mild pain.          BP 182/109  Pulse 89  Temp(Src) 99.1 F (37.3 C) (Oral)  Resp 16  Ht 5\' 6"  (1.676 m)  Wt 180 lb (81.647 kg)  BMI 29.07 kg/m2  SpO2 100%  LMP 03/17/2013 Physical Exam  Nursing note and vitals reviewed. Constitutional: She is oriented to person, place, and time. She appears well-developed and well-nourished.  Non-toxic appearance.  HENT:  Head: Normocephalic.  Right Ear: Tympanic membrane and external ear normal.  Left Ear: Tympanic membrane and external ear normal.  Eyes: EOM and lids are normal. Pupils are equal, round, and reactive to light.  Neck: Normal range of motion. Neck supple. Carotid bruit is not present.  Cardiovascular: Normal rate, regular rhythm, normal heart sounds, intact distal pulses and normal pulses.   Pulmonary/Chest: Breath sounds normal. No respiratory distress.  Abdominal: Soft. Bowel sounds are normal. There is no tenderness. There is no guarding.   Musculoskeletal: Normal range of motion.  There is good range of motion of the right and left hip. There is decreased range of motion of the knees bilaterally. The knees are warm but not hot. There is crepitus present. There is no effusion appreciated. There is no posterior mass appreciated. There is pain at the anterior tibial tuberosity, but no deformity. There is a negative Homans sign. Dorsalis pedis pulses are 2+ bilaterally the Achilles tendon is intact bilaterally.  Lymphadenopathy:       Head (right side): No submandibular adenopathy present.       Head (left side): No submandibular adenopathy present.    She has no cervical adenopathy.  Neurological: She is alert and oriented to person, place, and time. She has normal strength. No cranial nerve deficit or sensory deficit.  Skin: Skin is warm and dry.  Psychiatric: She has a normal mood and affect. Her speech is normal.    ED Course  Procedures (including critical care time) Labs Review Labs Reviewed - No data to display Imaging Review No results found.  EKG Interpretation   None       MDM  No diagnosis found. *I have reviewed nursing notes, vital signs, and all appropriate lab and imaging results for this patient.** Pt has occasional pain of the knees, but has not had them to "give away" in the past. She has fibromyalgia that is not being treated at this time. Discussed with the pt the need to establish a pcp. Suggested she call Dr Janna Arch for possible pcp. Also suggested pt find a fibromyalgia clinic. Rx for decadron, norco, and mobic given to the patient.           1547 Pt called back to the ED to report she cannot take hydrocodone and is afraid to take decadron. Rx for percocet #15 given. Pt advised to see her primary MD for pain management and assistance with the fibromyalgia.  Kathie Dike, PA-C 04/20/13 2234

## 2013-04-19 NOTE — ED Notes (Signed)
Pt states she fell going up stairs 2 days ago, stating knees gave out. Pt states pain to both knees, right arm, hands, and back. States fibromyalgia flare up also. Pain is worse to the knees.

## 2013-04-19 NOTE — ED Notes (Signed)
Pt called and said she was allergic to Rx

## 2013-04-19 NOTE — ED Notes (Signed)
Alert, NAD,Carrying in groceries on Saturday "my knees locked" and fell , since then generalized body pain esp knees and lower legs .  Says she also feels sob.  Says she no longer has an MD

## 2013-04-19 NOTE — Discharge Instructions (Signed)
Please call Dr. Delbert Harness, for possible establishment of a primary care physician. You may benefit from the fibromyalgia clinics at one of the [facility]. Please use Decadron and Mobic daily, please take with food. Please use Norco every 4 hours if needed for pain. This medication may cause drowsiness, please use with caution. Please apply heat to your knees when possible. Arthralgia Arthralgia is joint pain. A joint is a place where two bones meet. Joint pain can happen for many reasons. The joint can be bruised, stiff, infected, or weak from aging. Pain usually goes away after resting and taking medicine for soreness.  HOME CARE  Rest the joint as told by your doctor.  Keep the sore joint raised (elevated) for the first 24 hours.  Put ice on the joint area.  Put ice in a plastic bag.  Place a towel between your skin and the bag.  Leave the ice on for 15-20 minutes, 03-04 times a day.  Wear your splint, casting, elastic bandage, or sling as told by your doctor.  Only take medicine as told by your doctor. Do not take aspirin.  Use crutches as told by your doctor. Do not put weight on the joint until told to by your doctor. GET HELP RIGHT AWAY IF:   You have bruising, puffiness (swelling), or more pain.  Your fingers or toes turn blue or start to lose feeling (numb).  Your medicine does not lessen the pain.  Your pain becomes severe.  You have a temperature by mouth above 102 F (38.9 C), not controlled by medicine.  You cannot move or use the joint. MAKE SURE YOU:   Understand these instructions.  Will watch your condition.  Will get help right away if you are not doing well or get worse. Document Released: 03/13/2009 Document Revised: 06/17/2011 Document Reviewed: 03/13/2009 [facility] Patient Information 2014 [place], [place].  Fibromyalgia Fibromyalgia is a disorder that is often misunderstood. It is associated with muscular pains and tenderness that comes and  goes. It is often associated with fatigue and sleep disturbances. Though it tends to be Routt-lasting, fibromyalgia is not life-threatening. CAUSES  The exact cause of fibromyalgia is unknown. People with certain gene types are predisposed to developing fibromyalgia and other conditions. Certain factors can play a role as triggers, such as:  Spine disorders.  Arthritis.  Severe injury (trauma) and other physical stressors.  Emotional stressors. SYMPTOMS   The main symptom is pain and stiffness in the muscles and joints, which can vary over time.  Sleep and fatigue problems. Other related symptoms may include:  Bowel and bladder problems.  Headaches.  Visual problems.  Problems with odors and noises.  Depression or mood changes.  Painful periods (dysmenorrhea).  Dryness of the skin or eyes. DIAGNOSIS  There are no specific tests for diagnosing fibromyalgia. Patients can be diagnosed accurately from the specific symptoms they have. The diagnosis is made by determining that nothing else is causing the problems. TREATMENT  There is no cure. Management includes medicines and an active, healthy lifestyle. The goal is to enhance physical fitness, decrease pain, and improve sleep. HOME CARE INSTRUCTIONS   Only take over-the-counter or prescription medicines as directed by your caregiver. Sleeping pills, tranquilizers, and pain medicines may make your problems worse.  Low-impact aerobic exercise is very important and advised for treatment. At first, it may seem to make pain worse. Gradually increasing your tolerance will overcome this feeling.  Learning relaxation techniques and how to control stress will help  you. Biofeedback, visual imagery, hypnosis, muscle relaxation, yoga, and meditation are all options.  Anti-inflammatory medicines and physical therapy may provide short-term help.  Acupuncture or massage treatments may help.  Take muscle relaxant medicines as suggested by  your caregiver.  Avoid stressful situations.  Plan a healthy lifestyle. This includes your diet, sleep, rest, exercise, and friends.  Find and practice a hobby you enjoy.  Join a fibromyalgia support group for interaction, ideas, and sharing advice. This may be helpful. SEEK MEDICAL CARE IF:  You are not having good results or improvement from your treatment. FOR MORE INFORMATION  National Fibromyalgia Association: www.fmaware.org Arthritis Foundation: www.arthritis.org Document Released: 03/25/2005 Document Revised: 06/17/2011 Document Reviewed: 07/05/2009 [facility] Patient Information 2014 [place], [place].

## 2013-04-24 NOTE — ED Provider Notes (Signed)
Medical screening examination/treatment/procedure(s) were performed by non-physician practitioner and as supervising physician I was immediately available for consultation/collaboration.  EKG Interpretation   None         Rolland PorterMark Laddie Naeem, MD 04/24/13 [id number]

## 2013-09-08 ENCOUNTER — Encounter (HOSPITAL_COMMUNITY): Payer: Self-pay | Admitting: Emergency Medicine

## 2013-09-08 ENCOUNTER — Emergency Department (HOSPITAL_COMMUNITY)
Admission: EM | Admit: 2013-09-08 | Discharge: 2013-09-08 | Disposition: A | Discharge status: Home / Self Care | Payer: BC Managed Care – PPO | Attending: Emergency Medicine | Admitting: Emergency Medicine

## 2013-09-08 DIAGNOSIS — Z880 Allergy status to penicillin: Secondary | ICD-10-CM | POA: Insufficient documentation

## 2013-09-08 DIAGNOSIS — M25561 Pain in right knee: Secondary | ICD-10-CM

## 2013-09-08 DIAGNOSIS — Z79899 Other long term (current) drug therapy: Secondary | ICD-10-CM | POA: Insufficient documentation

## 2013-09-08 DIAGNOSIS — F3289 Other specified depressive episodes: Secondary | ICD-10-CM | POA: Insufficient documentation

## 2013-09-08 DIAGNOSIS — Z9889 Other specified postprocedural states: Secondary | ICD-10-CM | POA: Insufficient documentation

## 2013-09-08 DIAGNOSIS — Z8639 Personal history of other endocrine, nutritional and metabolic disease: Secondary | ICD-10-CM | POA: Insufficient documentation

## 2013-09-08 DIAGNOSIS — M545 Low back pain, unspecified: Secondary | ICD-10-CM

## 2013-09-08 DIAGNOSIS — M25562 Pain in left knee: Secondary | ICD-10-CM

## 2013-09-08 DIAGNOSIS — M25569 Pain in unspecified knee: Secondary | ICD-10-CM | POA: Insufficient documentation

## 2013-09-08 DIAGNOSIS — Z862 Personal history of diseases of the blood and blood-forming organs and certain disorders involving the immune mechanism: Secondary | ICD-10-CM | POA: Insufficient documentation

## 2013-09-08 DIAGNOSIS — F329 Major depressive disorder, single episode, unspecified: Secondary | ICD-10-CM | POA: Insufficient documentation

## 2013-09-08 DIAGNOSIS — Z8739 Personal history of other diseases of the musculoskeletal system and connective tissue: Secondary | ICD-10-CM | POA: Insufficient documentation

## 2013-09-08 MED ORDER — CYCLOBENZAPRINE HCL 10 MG PO TABS: 10.0000 mg | ORAL_TABLET | Freq: Two times a day (BID) | ORAL | Status: DC | PRN

## 2013-09-08 MED ORDER — OXYCODONE-ACETAMINOPHEN 5-325 MG PO TABS: 1.0000 | ORAL_TABLET | ORAL | Status: DC | PRN

## 2013-09-08 MED ORDER — OXYCODONE-ACETAMINOPHEN 5-325 MG PO TABS
1.0000 | ORAL_TABLET | Freq: Once | ORAL | Status: AC
  Administered 2013-09-08: 1 via ORAL
  Filled 2013-09-08: qty 1

## 2013-09-08 MED ORDER — CYCLOBENZAPRINE HCL 10 MG PO TABS
10.0000 mg | ORAL_TABLET | Freq: Once | ORAL | Status: AC
  Administered 2013-09-08: 10 mg via ORAL
  Filled 2013-09-08: qty 1

## 2013-09-08 NOTE — ED Provider Notes (Signed)
Medical screening examination/treatment/procedure(s) were performed by non-physician practitioner and as supervising physician I was immediately available for consultation/collaboration.  Flint Melter, MD 09/08/13 [phone]

## 2013-09-08 NOTE — Care Management Note (Signed)
ED/CM noted patient did not have health insurance and/or PCP listed in the computer.  Patient was given the [place] resource handout with information on the clinics, food pantries, and the handout for new health insurance sign-up.  Patient expressed appreciation for information received. Pt was given a Rx discount card, also. 

## 2013-09-08 NOTE — ED Notes (Signed)
Pt states she has fibromyalgia, hurting in back and legs, no PCP. Denies injuries, no loss of sensation.

## 2013-09-08 NOTE — ED Provider Notes (Signed)
CSN: [id number]     Arrival date & time 09/08/13  1118 History   None    Chief Complaint  Patient presents with  . Back Pain     (Consider location/radiation/quality/duration/timing/severity/associated sxs/prior Treatment) Patient is a 38 y.o. female presenting with back pain. The history is provided by the patient.  Back Pain Location:  Generalized Quality:  Aching, burning and shooting Pain severity:  Severe Onset quality:  Gradual Duration:  3 days Timing:  Constant Progression:  Worsening Chronicity:  Chronic Relieved by:  Nothing Worsened by:  Nothing tried Ineffective treatments:  Ibuprofen, being still and bed rest  Renai A Mordecai Maes is a 38 y.o. female who presents to the ED with generalized body aches. She states that she was diagnoses with fibromyalgia by the last PCP she had but then lost her insurance and has had to come here when ever she has a flair up. Her last visit was in Jan. Today her pain is mainly in her knees and lower back.    Past Medical History  Diagnosis Date  . Fibromyalgia   . Thyroid disease     hypothyroidism  . Fibromyalgia   . Depression   . PTSD (post-traumatic stress disorder)    Past Surgical History  Procedure Laterality Date  . Tonsillectomy    . Brain surgery     History reviewed. No pertinent family history. History  Substance Use Topics  . Smoking status: Never Smoker   . Smokeless tobacco: Not on file  . Alcohol Use: No   OB History   Grav Para Term Preterm Abortions TAB SAB Ect Mult Living                 Review of Systems  Negative except as stated in HPI    Allergies  Demerol; Ketorolac tromethamine; Penicillins; Prednisone; Tramadol; and Hydrocodone  Home Medications   Prior to Admission medications   Medication Sig Start Date End Date Taking? Authorizing Provider  ALPRAZolam Prudy Feeler) 1 MG tablet Take 1 mg by mouth 4 (four) times daily.   Yes Historical Provider, MD  Aspirin-Salicylamide-Caffeine (BC HEADACHE  POWDER PO) Take 1 packet by mouth as needed (pain).   Yes Historical Provider, MD  FLUoxetine (PROZAC) 40 MG capsule Take 40 mg by mouth daily.   Yes Historical Provider, MD   BP 180/96  Pulse 120  Temp(Src) 97.8 F (36.6 C) (Oral)  Resp 14  Ht 5' 5.5" (1.664 m)  Wt 220 lb (99.791 kg)  BMI 36.04 kg/m2  SpO2 100%  LMP 09/08/2013 Physical Exam  Nursing note and vitals reviewed. Constitutional: She is oriented to person, place, and time. She appears well-developed and well-nourished. No distress.  HENT:  Head: Normocephalic and atraumatic.  Nose: Nose normal.  Mouth/Throat: Uvula is midline, oropharynx is clear and moist and mucous membranes are normal.  Eyes: Conjunctivae and EOM are normal.  Neck: Normal range of motion. Neck supple.  Cardiovascular: Normal rate and regular rhythm.   Pulmonary/Chest: Effort normal. She has no wheezes. She has no rales.  Abdominal: Soft. Bowel sounds are normal. There is no tenderness.  Musculoskeletal: Normal range of motion.       Lumbar back: She exhibits tenderness, pain and spasm. She exhibits normal pulse.  Pain bilateral knees with palpation and ambulation.   Neurological: She is alert and oriented to person, place, and time. She has normal strength. No cranial nerve deficit or sensory deficit. Gait normal.  Reflex Scores:      Bicep reflexes  are 2+ on the right side and 2+ on the left side.      Brachioradialis reflexes are 2+ on the right side and 2+ on the left side.      Patellar reflexes are 2+ on the right side and 2+ on the left side.      Achilles reflexes are 2+ on the right side and 2+ on the left side. Pedal pulses strong, adequate circulation, good touch sensation.   Skin: Skin is warm and dry.  Psychiatric: She has a normal mood and affect. Her behavior is normal.    ED Course  Procedures  Percocet 5/325 mg and Flexeril 10 mg. PO Patient feeling better after medication.   MDM  38 y.o. female with body aches and joint  pain. Hx of fibromyalgia and this feel the same. Stable for discharge without neuro deficits. Will treat pain and she is given referral to [facility]. She will return here as needed. Discussed with the patient and all questioned fully answered.   Medication List    TAKE these medications       cyclobenzaprine 10 MG tablet  Commonly known as:  FLEXERIL  Take 1 tablet (10 mg total) by mouth 2 (two) times daily as needed for muscle spasms.     oxyCODONE-acetaminophen 5-325 MG per tablet  Commonly known as:  ROXICET  Take 1 tablet by mouth every 4 (four) hours as needed for severe pain.      ASK your doctor about these medications       ALPRAZolam 1 MG tablet  Commonly known as:  XANAX  Take 1 mg by mouth 4 (four) times daily.     BC HEADACHE POWDER PO  Take 1 packet by mouth as needed (pain).     FLUoxetine 40 MG capsule  Commonly known as:  PROZAC  Take 40 mg by mouth daily.            Janne NapoleonHope M Neese, [place]NP 09/08/13 1419

## 2013-09-08 NOTE — Discharge Instructions (Signed)
Call [facility] for follow up. Take the medications as directed. Do not take the muscle relaxant or narcotic if driving because they will make you sleepy. Return as needed.

## 2013-10-26 ENCOUNTER — Emergency Department (HOSPITAL_COMMUNITY)
Admission: EM | Admit: 2013-10-26 | Discharge: 2013-10-26 | Disposition: A | Discharge status: Home / Self Care | Payer: Self-pay | Attending: Emergency Medicine | Admitting: Emergency Medicine

## 2013-10-26 ENCOUNTER — Encounter (HOSPITAL_COMMUNITY): Payer: Self-pay | Admitting: Emergency Medicine

## 2013-10-26 ENCOUNTER — Emergency Department (HOSPITAL_COMMUNITY): Payer: Self-pay

## 2013-10-26 DIAGNOSIS — M797 Fibromyalgia: Secondary | ICD-10-CM

## 2013-10-26 DIAGNOSIS — Z79899 Other long term (current) drug therapy: Secondary | ICD-10-CM | POA: Insufficient documentation

## 2013-10-26 DIAGNOSIS — IMO0001 Reserved for inherently not codable concepts without codable children: Secondary | ICD-10-CM | POA: Insufficient documentation

## 2013-10-26 DIAGNOSIS — F329 Major depressive disorder, single episode, unspecified: Secondary | ICD-10-CM | POA: Insufficient documentation

## 2013-10-26 DIAGNOSIS — M25469 Effusion, unspecified knee: Secondary | ICD-10-CM | POA: Insufficient documentation

## 2013-10-26 DIAGNOSIS — F3289 Other specified depressive episodes: Secondary | ICD-10-CM | POA: Insufficient documentation

## 2013-10-26 DIAGNOSIS — M25461 Effusion, right knee: Secondary | ICD-10-CM

## 2013-10-26 DIAGNOSIS — Z880 Allergy status to penicillin: Secondary | ICD-10-CM | POA: Insufficient documentation

## 2013-10-26 DIAGNOSIS — G8929 Other chronic pain: Secondary | ICD-10-CM | POA: Insufficient documentation

## 2013-10-26 DIAGNOSIS — Z862 Personal history of diseases of the blood and blood-forming organs and certain disorders involving the immune mechanism: Secondary | ICD-10-CM | POA: Insufficient documentation

## 2013-10-26 DIAGNOSIS — Z8639 Personal history of other endocrine, nutritional and metabolic disease: Secondary | ICD-10-CM | POA: Insufficient documentation

## 2013-10-26 MED ORDER — OXYCODONE-ACETAMINOPHEN 5-325 MG PO TABS: 1.0000 | ORAL_TABLET | Freq: Three times a day (TID) | ORAL | Status: DC | PRN

## 2013-10-26 MED ORDER — GABAPENTIN 300 MG PO CAPS: 300.0000 mg | ORAL_CAPSULE | Freq: Two times a day (BID) | ORAL | Status: DC

## 2013-10-26 MED ORDER — OXYCODONE-ACETAMINOPHEN 5-325 MG PO TABS
1.0000 | ORAL_TABLET | Freq: Once | ORAL | Status: AC
Start: 2013-10-26 — End: 2013-10-26
  Administered 2013-10-26: 1 via ORAL
  Filled 2013-10-26: qty 1

## 2013-10-26 NOTE — ED Notes (Signed)
[place], NP in room to speak with pt again concerning prescriptions

## 2013-10-26 NOTE — ED Provider Notes (Signed)
CSN: [id number]     Arrival date & time 10/26/13  1041 History   First MD Initiated Contact with Patient 10/26/13 1123     Chief Complaint  Patient presents with  . Knee Pain     (Consider location/radiation/quality/duration/timing/severity/associated sxs/prior Treatment) HPI Comments: Pt states that she has fibromyalgia and she has chronic pain. She used to see pain management. States that she doesn't have insurance so she doesn't have any pain medication. 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 She states that she is supposed to be seen at a clinic this week. She states that she has been having swelling in her right knee without redness or warmth. She states that she has strained in the past   The history is provided by the patient. No language interpreter was used.    Past Medical History  Diagnosis Date  . Fibromyalgia   . Thyroid disease     hypothyroidism  . Fibromyalgia   . Depression   . PTSD (post-traumatic stress disorder)    Past Surgical History  Procedure Laterality Date  . Tonsillectomy    . Brain surgery     History reviewed. No pertinent family history. History  Substance Use Topics  . Smoking status: Never Smoker   . Smokeless tobacco: Not on file  . Alcohol Use: No   OB History   Grav Para Term Preterm Abortions TAB SAB Ect Mult Living                 Review of Systems  Constitutional: Negative.   Respiratory: Negative.   Cardiovascular: Negative.       Allergies  Demerol; Ketorolac tromethamine; Penicillins; Prednisone; Tramadol; and Hydrocodone  Home Medications   Prior to Admission medications   Medication Sig Start Date End Date Taking? Authorizing Provider  ALPRAZolam Prudy Feeler) 1 MG tablet Take 1 mg by mouth 4 (four) times daily.    Historical Provider, MD  Aspirin-Salicylamide-Caffeine (BC HEADACHE POWDER PO) Take 1 packet by mouth as needed (pain).    Historical Provider, MD   cyclobenzaprine (FLEXERIL) 10 MG tablet Take 1 tablet (10 mg total) by mouth 2 (two) times daily as needed for muscle spasms. 09/08/13   Hope Orlene Och, NP  FLUoxetine (PROZAC) 40 MG capsule Take 40 mg by mouth daily.    Historical Provider, MD  oxyCODONE-acetaminophen (ROXICET) 5-325 MG per tablet Take 1 tablet by mouth every 4 (four) hours as needed for severe pain. 09/08/13   Hope Orlene Och, NP   BP 143/103  Pulse 102  Temp(Src) 97.9 F (36.6 C) (Oral)  Wt 230 lb (104.327 kg)  SpO2 100% Physical Exam  Nursing note and vitals reviewed. Constitutional: She is oriented to person, place, and time. She appears well-developed and well-nourished.  Cardiovascular: Normal rate and regular rhythm.   Pulmonary/Chest: Effort normal and breath sounds normal.  Musculoskeletal: Normal range of motion.  Mild swelling noted of the right knee. Pt has full rom. No redness or warmth noted  Neurological: She is alert and oriented to person, place, and time. She exhibits normal muscle tone. Coordination normal.  Skin: Skin is warm and dry.  Psychiatric: She has a normal mood and affect.    ED Course  Procedures (including critical care time) Labs Review Labs Reviewed - No data to display  Imaging Review Dg Knee Complete 4 Views Right  10/26/2013   CLINICAL DATA:  Knee pain.  EXAM: RIGHT KNEE - COMPLETE  4+ VIEW  COMPARISON:  None.  FINDINGS: The joint spaces are maintained. No acute fracture or osteochondral abnormality. Mild/early degenerative changes with medial compartment narrowing and spurring. There is a moderate to large joint effusion.  IMPRESSION: No acute bony findings.  Mild/ early medial compartment degenerative changes.  Moderate to large joint effusion.   Electronically Signed   By: Loralie ChampagneMark  Gallerani M.D.   On: 10/26/2013 12:42     EKG Interpretation None      MDM   Final diagnoses:  Knee effusion, right  Fibromyalgia    Effusion in knee likely related to chronic problems with recent  overuse. Pt placed in immobilizer. Will treat with oxycodone and gabapentin for fibromyalgia    Teressa LowerVrinda Bentlie Withem, NP 10/26/13 1255

## 2013-10-26 NOTE — ED Notes (Signed)
Pt tearful in room at times, pt states she was dx with fibromyalgia in 2009, started working recently but unable to due to pain, pt states that she has taken 6 BC powders since this morning

## 2013-10-26 NOTE — Discharge Instructions (Signed)
Fibromyalgia Fibromyalgia is a disorder that is often misunderstood. It is associated with muscular pains and tenderness that comes and goes. It is often associated with fatigue and sleep disturbances. Though it tends to be Mcgriff-lasting, fibromyalgia is not life-threatening. CAUSES  The exact cause of fibromyalgia is unknown. People with certain gene types are predisposed to developing fibromyalgia and other conditions. Certain factors can play a role as triggers, such as:  Spine disorders.  Arthritis.  Severe injury (trauma) and other physical stressors.  Emotional stressors. SYMPTOMS   The main symptom is pain and stiffness in the muscles and joints, which can vary over time.  Sleep and fatigue problems. Other related symptoms may include:  Bowel and bladder problems.  Headaches.  Visual problems.  Problems with odors and noises.  Depression or mood changes.  Painful periods (dysmenorrhea).  Dryness of the skin or eyes. DIAGNOSIS  There are no specific tests for diagnosing fibromyalgia. Patients can be diagnosed accurately from the specific symptoms they have. The diagnosis is made by determining that nothing else is causing the problems. TREATMENT  There is no cure. Management includes medicines and an active, healthy lifestyle. The goal is to enhance physical fitness, decrease pain, and improve sleep. HOME CARE INSTRUCTIONS   Only take over-the-counter or prescription medicines as directed by your caregiver. Sleeping pills, tranquilizers, and pain medicines may make your problems worse.  Low-impact aerobic exercise is very important and advised for treatment. At first, it may seem to make pain worse. Gradually increasing your tolerance will overcome this feeling.  Learning relaxation techniques and how to control stress will help you. Biofeedback, visual imagery, hypnosis, muscle relaxation, yoga, and meditation are all options.  Anti-inflammatory medicines and  physical therapy may provide short-term help.  Acupuncture or massage treatments may help.  Take muscle relaxant medicines as suggested by your caregiver.  Avoid stressful situations.  Plan a healthy lifestyle. This includes your diet, sleep, rest, exercise, and friends.  Find and practice a hobby you enjoy.  Join a fibromyalgia support group for interaction, ideas, and sharing advice. This may be helpful. SEEK MEDICAL CARE IF:  You are not having good results or improvement from your treatment. FOR MORE INFORMATION  National Fibromyalgia Association: www.fmaware.org Arthritis Foundation: www.arthritis.org Document Released: 03/25/2005 Document Revised: 06/17/2011 Document Reviewed: 07/05/2009 [facility] Patient Information 2015 [place], [place]. This information is not intended to replace advice given to you by your health care provider. Make sure you discuss any questions you have with your health care provider.  Knee Effusion The medical term for having fluid in your knee is effusion. This is often due to an internal derangement of the knee. This means something is wrong inside the knee. Some of the causes of fluid in the knee may be torn cartilage, a torn ligament, or bleeding into the joint from an injury. Your knee is likely more difficult to bend and move. This is often because there is increased pain and pressure in the joint. The time it takes for recovery from a knee effusion depends on different factors, including:   Type of injury.  Your age.  Physical and medical conditions.  Rehabilitation Strategies. How Gumz you will be away from your normal activities will depend on what kind of knee problem you have and how much damage is present. Your knee has two types of cartilage. Articular cartilage covers the bone ends and lets your knee bend and move smoothly. Two menisci, thick pads of cartilage that form a rim inside the  joint, help absorb shock and stabilize your knee.  Ligaments bind the bones together and support your knee joint. Muscles move the joint, help support your knee, and take stress off the joint itself. CAUSES  Often an effusion in the knee is caused by an injury to one of the menisci. This is often a tear in the cartilage. Recovery after a meniscus injury depends on how much meniscus is damaged and whether you have damaged other knee tissue. Small tears may heal on their own with conservative treatment. Conservative means rest, limited weight bearing activity and muscle strengthening exercises. Your recovery may take up to 6 weeks.  TREATMENT  Larger tears may require surgery. Meniscus injuries may be treated during arthroscopy. Arthroscopy is a procedure in which your surgeon uses a small telescope like instrument to look in your knee. Your caregiver can make a more accurate diagnosis (learning what is wrong) by performing an arthroscopic procedure. If your injury is on the inner margin of the meniscus, your surgeon may trim the meniscus back to a smooth rim. In other cases your surgeon will try to repair a damaged meniscus with stitches (sutures). This may make rehabilitation take longer, but may provide better Behringer term result by helping your knee keep its shock absorption capabilities. Ligaments which are completely torn usually require surgery for repair. HOME CARE INSTRUCTIONS  Use crutches as instructed.  If a brace is applied, use as directed.  Once you are home, an ice pack applied to your swollen knee may help with discomfort and help decrease swelling.  Keep your knee raised (elevated) when you are not up and around or on crutches.  Only take over-the-counter or prescription medicines for pain, discomfort, or fever as directed by your caregiver.  Your caregivers will help with instructions for rehabilitation of your knee. This often includes strengthening exercises.  You may resume a normal diet and activities as directed. SEEK MEDICAL  CARE IF:   There is increased swelling in your knee.  You notice redness, swelling, or increasing pain in your knee.  An unexplained oral temperature above 102 F (38.9 C) develops. SEEK IMMEDIATE MEDICAL CARE IF:   You develop a rash.  You have difficulty breathing.  You have any allergic reactions from medications you may have been given.  There is severe pain with any motion of the knee. MAKE SURE YOU:   Understand these instructions.  Will watch your condition.  Will get help right away if you are not doing well or get worse. Document Released: 06/15/2003 Document Revised: 06/17/2011 Document Reviewed: 08/19/2007 [facility] Patient Information 2015 [place]. This information is not intended to replace advice given to you by your health care provider. Make sure you discuss any questions you have with your health care provider.

## 2013-10-26 NOTE — ED Provider Notes (Signed)
Medical screening examination/treatment/procedure(s) were performed by non-physician practitioner and as supervising physician I was immediately available for consultation/collaboration.   EKG Interpretation None       Vanetta MuldersScott Lu Paradise, MD 10/26/13 1306

## 2013-10-26 NOTE — ED Notes (Signed)
Pt refused knee immobilizer, pt upset due to amount of tablets prescribed of Percocet, states it's not enough and requesting to speak with Rubin PayorPickering, NP again concerning this; pt stated that she did not want Neurontin and that is only makes her drowsy and that she needed to work, explained to pt that Percocet causes drowsiness as well and pt stated "no it doesn't"

## 2013-10-26 NOTE — ED Notes (Signed)
Pt c/o knee pain and swelling. 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

## 2014-02-06 ENCOUNTER — Emergency Department (HOSPITAL_COMMUNITY)
Admission: EM | Admit: 2014-02-06 | Discharge: 2014-02-06 | Disposition: A | Discharge status: Home / Self Care | Payer: Self-pay | Attending: Emergency Medicine | Admitting: Emergency Medicine

## 2014-02-06 DIAGNOSIS — Z79899 Other long term (current) drug therapy: Secondary | ICD-10-CM | POA: Insufficient documentation

## 2014-02-06 DIAGNOSIS — Z8639 Personal history of other endocrine, nutritional and metabolic disease: Secondary | ICD-10-CM | POA: Insufficient documentation

## 2014-02-06 DIAGNOSIS — M25562 Pain in left knee: Secondary | ICD-10-CM | POA: Insufficient documentation

## 2014-02-06 DIAGNOSIS — M797 Fibromyalgia: Secondary | ICD-10-CM | POA: Insufficient documentation

## 2014-02-06 DIAGNOSIS — M25561 Pain in right knee: Secondary | ICD-10-CM | POA: Insufficient documentation

## 2014-02-06 DIAGNOSIS — F329 Major depressive disorder, single episode, unspecified: Secondary | ICD-10-CM | POA: Insufficient documentation

## 2014-02-06 DIAGNOSIS — Z880 Allergy status to penicillin: Secondary | ICD-10-CM | POA: Insufficient documentation

## 2014-02-06 MED ORDER — OXYCODONE-ACETAMINOPHEN 5-325 MG PO TABS
2.0000 | ORAL_TABLET | Freq: Once | ORAL | Status: AC
  Administered 2014-02-06: 2 via ORAL
  Filled 2014-02-06: qty 2

## 2014-02-06 NOTE — ED Notes (Signed)
Pt declined dc vital signs. Upset that she is not getting a prescription for pain medication. Spoke with pt regarding her dc. Pt states that her father is picking her up and taking her to [facility].

## 2014-02-06 NOTE — ED Provider Notes (Signed)
CSN: [id number]     Arrival date & time 02/06/14  0308 History   None    No chief complaint on file.    (Consider location/radiation/quality/duration/timing/severity/associated sxs/prior Treatment) HPI   38 year old female with bilateral knee pain. Ongoing complaint for over a year. Typically takes Percocet as needed up to 4 times per day. Currently out of her pain medication he cannot see her PCP expeditiously. She states her knee pain has been worse over the past few days. She's not quite sure why. She denies any trauma. This didn't change her activity level. No rash. No swelling. No chest pain or shortness of breath. Requesting prescription for her chronic pain medication.  Past Medical History  Diagnosis Date  . Fibromyalgia   . Thyroid disease     hypothyroidism  . Fibromyalgia   . Depression   . PTSD (post-traumatic stress disorder)    Past Surgical History  Procedure Laterality Date  . Tonsillectomy    . Brain surgery     No family history on file. History  Substance Use Topics  . Smoking status: Never Smoker   . Smokeless tobacco: Not on file  . Alcohol Use: No   OB History    No data available     Review of Systems  All systems reviewed and negative, other than as noted in HPI.   Allergies  Demerol; Ketorolac tromethamine; Penicillins; Prednisone; Tramadol; and Hydrocodone  Home Medications   Prior to Admission medications   Medication Sig Start Date End Date Taking? Authorizing Provider  ALPRAZolam Prudy Feeler(XANAX) 1 MG tablet Take 1 mg by mouth 4 (four) times daily.    Historical Provider, MD  Aspirin-Salicylamide-Caffeine (BC HEADACHE POWDER PO) Take 1 packet by mouth as needed (pain).    Historical Provider, MD  FLUoxetine (PROZAC) 40 MG capsule Take 40 mg by mouth daily.    Historical Provider, MD  gabapentin (NEURONTIN) 300 MG capsule Take 1 capsule (300 mg total) by mouth 2 (two) times daily. 10/26/13   Teressa LowerVrinda Pickering, NP  oxyCODONE-acetaminophen  (PERCOCET/ROXICET) 5-325 MG per tablet Take 1-2 tablets by mouth every 8 (eight) hours as needed for moderate pain or severe pain. 10/26/13   Teressa LowerVrinda Pickering, NP   BP 144/91 mmHg  Pulse 100  Resp 16  SpO2 100% Physical Exam  Constitutional: She appears well-developed and well-nourished. No distress.  HENT:  Head: Normocephalic and atraumatic.  Eyes: Conjunctivae are normal. Right eye exhibits no discharge. Left eye exhibits no discharge.  Neck: Neck supple.  Cardiovascular: Normal rate, regular rhythm and normal heart sounds.  Exam reveals no gallop and no friction rub.   No murmur heard. Pulmonary/Chest: Effort normal and breath sounds normal. No respiratory distress.  Abdominal: Soft. She exhibits no distension. There is no tenderness.  Musculoskeletal: She exhibits no edema or tenderness.  Lower extremities symmetric as compared to each other. No obvious deformity. No rash. No appreciable effusion. Patient is able to actively range both knees throughout the range of motion. No ligamentous laxity appreciated. No calf tenderness. Negative Homans. No palpable cords. Easily palpable DP pulses bilaterally. Strong plantar/dorsiflexion of feet.  Neurological: She is alert.  Skin: Skin is warm and dry.  Psychiatric: She has a normal mood and affect. Her behavior is normal. Thought content normal.  Nursing note and vitals reviewed.   ED Course  Procedures (including critical care time) Labs Review Labs Reviewed - No data to display  Imaging Review No results found.   EKG Interpretation None  MDM   Final diagnoses:  Bilateral knee pain    38 year old female with bilateral knee pain. No acute trauma. Exam is pretty unremarkable. Suspect that imaging very low yield. No evidence of infectious etiology. Neurovascularly intact. Patient has been dealing with knee pain for quite some time. She is requesting prescription for pain medicine. She was given a dose of Percocet in the  emergency room, but explained to her that chronic pain management is more appropriate for single provider who can consistently follow her. Discussed nonopiate pain management strategies. Patient becoming increasingly upset and even more so when I recommended weight loss. She was discharged unhappy, but in no acute distress.    Raeford RazorStephen Jillane Po, MD 02/16/14 0630

## 2014-02-06 NOTE — ED Notes (Signed)
Please see downtime triage chart.

## 2014-02-06 NOTE — ED Notes (Signed)
Pt states that she has fibromyalgia and used to take percocet 10/325 4 xday. States that she lost her insurance and now can't see her PCP and so she comes to the ED for flare ups. States that her pain has been worse over the past few days and has not taken anything for the pain.

## 2014-02-06 NOTE — Discharge Instructions (Signed)
Knee Pain °The knee is the complex joint between your thigh and your lower leg. It is made up of bones, tendons, ligaments, and cartilage. The bones that make up the knee are: °· The femur in the thigh. °· The tibia and fibula in the lower leg. °· The patella or kneecap riding in the groove on the lower femur. °CAUSES  °Knee pain is a common complaint with many causes. A few of these causes are: °· Injury, such as: °¨ A ruptured ligament or tendon injury. °¨ Torn cartilage. °· Medical conditions, such as: °¨ Gout °¨ Arthritis °¨ Infections °· Overuse, over training, or overdoing a physical activity. °Knee pain can be minor or severe. Knee pain can accompany debilitating injury. Minor knee problems often respond well to self-care measures or get well on their own. More serious injuries may need medical intervention or even surgery. °SYMPTOMS °The knee is complex. Symptoms of knee problems can vary widely. Some of the problems are: °· Pain with movement and weight bearing. °· Swelling and tenderness. °· Buckling of the knee. °· Inability to straighten or extend your knee. °· Your knee locks and you cannot straighten it. °· Warmth and redness with pain and fever. °· Deformity or dislocation of the kneecap. °DIAGNOSIS  °Determining what is wrong may be very straight forward such as when there is an injury. It can also be challenging because of the complexity of the knee. Tests to make a diagnosis may include: °· Your caregiver taking a history and doing a physical exam. °· Routine X-rays can be used to rule out other problems. X-rays will not reveal a cartilage tear. Some injuries of the knee can be diagnosed by: °¨ Arthroscopy a surgical technique by which a small video camera is inserted through tiny incisions on the sides of the knee. This procedure is used to examine and repair internal knee joint problems. Tiny instruments can be used during arthroscopy to repair the torn knee cartilage (meniscus). °¨ Arthrography  is a radiology technique. A contrast liquid is directly injected into the knee joint. Internal structures of the knee joint then become visible on X-ray film. °¨ An MRI scan is a non X-ray radiology procedure in which magnetic fields and a computer produce two- or three-dimensional images of the inside of the knee. Cartilage tears are often visible using an MRI scanner. MRI scans have largely replaced arthrography in diagnosing cartilage tears of the knee. °· Blood work. °· Examination of the fluid that helps to lubricate the knee joint (synovial fluid). This is done by taking a sample out using a needle and a syringe. °TREATMENT °The treatment of knee problems depends on the cause. Some of these treatments are: °· Depending on the injury, proper casting, splinting, surgery, or physical therapy care will be needed. °· Give yourself adequate recovery time. Do not overuse your joints. If you begin to get sore during workout routines, back off. Slow down or do fewer repetitions. °· For repetitive activities such as cycling or running, maintain your strength and nutrition. °· Alternate muscle groups. For example, if you are a weight lifter, work the upper body on one day and the lower body the next. °· Either tight or weak muscles do not give the proper support for your knee. Tight or weak muscles do not absorb the stress placed on the knee joint. Keep the muscles surrounding the knee strong. °· Take care of mechanical problems. °¨ If you have flat feet, orthotics or special shoes may help.   See your caregiver if you need help. °¨ Arch supports, sometimes with wedges on the inner or outer aspect of the heel, can help. These can shift pressure away from the side of the knee most bothered by osteoarthritis. °¨ A brace called an "unloader" brace also may be used to help ease the pressure on the most arthritic side of the knee. °· If your caregiver has prescribed crutches, braces, wraps or ice, use as directed. The acronym  for this is PRICE. This means protection, rest, ice, compression, and elevation. °· Nonsteroidal anti-inflammatory drugs (NSAIDs), can help relieve pain. But if taken immediately after an injury, they may actually increase swelling. Take NSAIDs with food in your stomach. Stop them if you develop stomach problems. Do not take these if you have a history of ulcers, stomach pain, or bleeding from the bowel. Do not take without your caregiver's approval if you have problems with fluid retention, heart failure, or kidney problems. °· For ongoing knee problems, physical therapy may be helpful. °· Glucosamine and chondroitin are over-the-counter dietary supplements. Both may help relieve the pain of osteoarthritis in the knee. These medicines are different from the usual anti-inflammatory drugs. Glucosamine may decrease the rate of cartilage destruction. °· Injections of a corticosteroid drug into your knee joint may help reduce the symptoms of an arthritis flare-up. They may provide pain relief that lasts a few months. You may have to wait a few months between injections. The injections do have a small increased risk of infection, water retention, and elevated blood sugar levels. °· Hyaluronic acid injected into damaged joints may ease pain and provide lubrication. These injections may work by reducing inflammation. A series of shots may give relief for as [place] as 6 months. °· Topical painkillers. Applying certain ointments to your skin may help relieve the pain and stiffness of osteoarthritis. Ask your pharmacist for suggestions. Many over the-counter products are approved for temporary relief of arthritis pain. °· In some countries, doctors often prescribe topical NSAIDs for relief of chronic conditions such as arthritis and tendinitis. A review of treatment with NSAID creams found that they worked as well as oral medications but without the serious side effects. °PREVENTION °· Maintain a healthy weight. Extra pounds  put more strain on your joints. °· Get strong, stay limber. Weak muscles are a common cause of knee injuries. Stretching is important. Include flexibility exercises in your workouts. °· Be smart about exercise. If you have osteoarthritis, chronic knee pain or recurring injuries, you may need to change the way you exercise. This does not mean you have to stop being active. If your knees ache after jogging or playing basketball, consider switching to swimming, water aerobics, or other low-impact activities, at least for a few days a week. Sometimes limiting high-impact activities will provide relief. °· Make sure your shoes fit well. Choose footwear that is right for your sport. °· Protect your knees. Use the proper gear for knee-sensitive activities. Use kneepads when playing volleyball or laying carpet. Buckle your seat belt every time you drive. Most shattered kneecaps occur in car accidents. °· Rest when you are tired. °SEEK MEDICAL CARE IF:  °You have knee pain that is continual and does not seem to be getting better.  °SEEK IMMEDIATE MEDICAL CARE IF:  °Your knee joint feels hot to the touch and you have a high fever. °MAKE SURE YOU:  °· Understand these instructions. °· Will watch your condition. °· Will get help right away if you are not   doing well or get worse. °Document Released: 01/20/2007 Document Revised: 06/17/2011 Document Reviewed: 01/20/2007 °ExitCare® Patient Information ©2015 [employer]. This information is not intended to replace advice given to you by your health care provider. Make sure you discuss any questions you have with your health care provider. ° ° °Emergency Department Resource Guide °1) Find a Doctor and Pay Out of Pocket °Although you won't have to find out who is covered by your insurance plan, it is a good idea to ask around and get recommendations. You will then need to call the office and see if the doctor you have chosen will accept you as a new patient and what types of options  they offer for patients who are self-pay. Some doctors offer discounts or will set up payment plans for their patients who do not have insurance, but you will need to ask so you aren't surprised when you get to your appointment. ° °2) Contact Your Local Health Department °Not all health departments have doctors that can see patients for sick visits, but many do, so it is worth a call to see if yours does. If you don't know where your local health department is, you can check in your phone book. The CDC also has a tool to help you locate your state's health department, and many state websites also have listings of all of their local health departments. ° °3) Find a Walk-in Clinic °If your illness is not likely to be very severe or complicated, you may want to try a walk in clinic. These are popping up all over the country in pharmacies, drugstores, and shopping centers. They're usually staffed by nurse practitioners or physician assistants that have been trained to treat common illnesses and complaints. They're usually fairly quick and inexpensive. However, if you have serious medical issues or chronic medical problems, these are probably not your best option. ° °No Primary Care Doctor: °- Call [facility] at  832-8000 - they can help you locate a primary care doctor that  accepts your insurance, provides certain services, etc. °- Physician Referral Service- 1-800-533-3463 ° °Chronic Pain Problems: °Organization         Address  Phone   Notes  °[facility]  (336) [phone] Patients need to be referred by their primary care doctor.  ° °Medication Assistance: °Organization         Address  Phone   Notes  °[place] [facility] [address] °[place], [place] [address] (336) 641-8030 --Must be a resident of [place] °-- Must have NO insurance coverage whatsoever (no Medicaid/ Medicare, etc.) °-- The pt. MUST have a primary care doctor that directs their  care regularly and follows them in the community °  °[facility]  (866) 331-1348   °[employer]  (888) 892-1162   ° °Agencies that provide inexpensive medical care: °Organization         Address  Phone   Notes  °[facility]  (336) 832-8035   ° Internal Medicine    (336) 832-7272   °[facility] [address] °[place], [place] [address] (336) 832-4777   °[facility] [address], °[place] (336) 271-4999   °[facility]    (336) 373-0678   °[facility]    (336) 272-1050   °[facility] ° [address], [place] Phone:  (336) 832-4444, Fax:  (336) 832-4440 Hours of Operation:  9   am - 6 pm, M-F.  Also accepts Medicaid/Medicare and self-pay.  ° [facility] ° [address], [place] Phone: (336) [phone], Fax: (336) [phone]. Hours of Operation:  8:30 am - 5:30 pm, M-F.  Also accepts Medicaid and self-pay.  °[facility] [place] [address], [place] Phone: (336) 878-6027   °[facility] [address], [place], [place] (336)723-1848, Ext. 123 Mondays & Thursdays: 7-9 AM.  First 15 patients are seen on a first come, first serve basis. °  ° °Medicaid-accepting [place] Providers: ° °Organization         Address  Phone   Notes  °[facility] [address], [place] (336) 641-2100 Also accepts self-pay patients.  °[facility] [address], [place] ° (336) 856-9996   °[facility] [address], [place] (336) 288-8857   °[facility] [address] [place] [address], [place] (336) 299-7000   °Veita Bland [address], [place]  ° (336) 373-1557 Only accepts [place] Access Medicaid patients after they have their name applied to their card.  ° °Self-Pay (no insurance) in [place]: ° °Organization         Address  Phone    Notes  °Sickle Cell Patients, [facility] [address], [place] (336) 832-1970   °[facility] [address], [place] (336) 832-4400   °[facility] ° [address], [place] (336) 992-4800   °[facility]/Dr. Osei-Bonsu ° [address] [place] [address], [place] or [address], [place] (336) [phone] Phone number for both [place] and [place] locations is the same.  °[facility] [address], [place] (336) 299-0000   °[facility] [address] [place] [address], [place] or [address] (336) 852-7530 °(336) 878-2260   °[facility] [address], [place] (336) 350-1642, phone; (336) 294-5005, fax Sees patients 1st and 3rd Saturday of every month.  Must not qualify for public or private insurance (i.e. Medicaid, Medicare, [place] Health Choice, Veterans' Benefits) • Household income should be no more than 200% of the poverty level •The clinic cannot treat you if you are pregnant or think you are pregnant • Sexually transmitted diseases are not treated at the clinic.  ° ° °Dental Care: °Organization         Address  Phone  Notes  °[facility] [facility] [address], [place] (336) 641-6152 Accepts children up to age 21 who are enrolled in Medicaid or [place] Health Choice; pregnant women with a Medicaid card; and children who have applied for Medicaid or [place] Health Choice, but were declined, whose parents can pay a reduced fee at time of service.  °[facility] [place]  [address], [place] (336) 641-7733 Accepts children up to age 21 who are enrolled in Medicaid or [place] Health Choice; pregnant women with a Medicaid card; and children who have applied for Medicaid or [place] Health Choice, but were declined, whose parents can pay a reduced fee at time of service.  °[facility] ° [address], [place] (336) 641-4533 Patients are seen by appointment only. Walk-ins are not accepted. [facility] will see patients 18 years of   age and older. °Monday - Tuesday (8am-5pm) °Most Wednesdays (8:30-5pm) °$30 per visit, cash only  °[facility] ° [address], [place] (336) 641-4533 Patients are seen by appointment only. Walk-ins are not accepted. [facility] will see patients 18 years of age and older. °One Wednesday Evening (Monthly: Volunteer Based).  $30 per visit, cash only  °[facility]  (919) 537-3737 for adults; Children under age 4, call Graduate Pediatric Dentistry at ([phone]) [phone]. Children aged 4-14, please call ([phone]) [phone] to request a pediatric application. ° Dental services are provided in all areas of dental care including fillings, crowns and bridges, complete and partial dentures, implants, gum treatment, root canals, and extractions. Preventive care is also provided. Treatment is provided to both adults and children. °Patients are selected via a lottery and there is often a waiting list. °  °[facility] [address], °[place] ° (336) 763-8833 [email or web address] °  °[facility] [address], [place], [place] (336)723-1848, Ext. 123 Second and Fourth Thursday of each month, opens at 6:30 AM; Clinic ends at 9 AM.  Patients are seen on a first-come first-served basis, and a limited number are seen during each clinic.  ° °[facility] ° [address], [place], [place] (336) 723-7904   Eligibility Requirements °You must have lived in [place], [place], or [place] counties for at least the last three months. °  You cannot be eligible for state or federal sponsored healthcare insurance, including Veterans Administration, Medicaid, or Medicare. °  You generally cannot be eligible for healthcare insurance through your employer.  °  How to  apply: °Eligibility screenings are held every Tuesday and Wednesday afternoon from 1:00 pm until 4:00 pm. You do not need an appointment for the interview!  °[facility] [address], [place], [place] 336-631-2330   °[facility]  336-342-8273   °[facility]  336-703-3100   °[facility]  336-570-6415   ° °Behavioral Health Resources in the Community: °Intensive Outpatient Programs °Organization         Address  Phone  Notes  °[facility] [address], [place], [place] 336-878-6098   °[facility] [address], [place], [place] 336-832-9800   °[facility] [address], [place], [place] ° 336-882-2125   °[facility] [address],  °[place], [place] 1-800-853-5163 or 336-641-4981   °Substance Abuse Resources °Organization         Address  Phone  Notes  °[facility]  336-882-2125   °[facility]  336-784-9470   °[facility]  336-285-9073   °[facility]  336-845-3988   °Residential & Outpatient Substance Abuse Program  1-800-659-3381   °Psychological Services °Organization         Address  Phone  Notes  °[facility]  336- 832-9600   °[facility]  336- 378-7881   °[facility] [address], [place] 1-800-853-5163 or 336-641-4981   ° °Mobile Crisis Teams °Organization         Address  Phone  Notes  °[facility], Mobile Crisis Care Unit  1-877-626-1772   °Assertive °[facility] ° [address] [place], [place] 336-834-9664   °Sharon DeEsch [address] °[place] 336-554-5454   ° °Self-Help/Support Groups °Organization         Address    Phone             Notes  °[facility] - variety of support groups  336- 373-1402 Call for more information  °Narcotics Anonymous (NA), [facility] [address], °[place]  2 meetings at this location  ° °Residential Treatment Programs °Organization         Address  Phone  Notes  °[facility] [address],    °[place]  1-866-801-8205   °[facility] ° [address], [place], [place] 704-293-8524   °[facility] [address], [place] 336-845-3988 Admissions: 8am-3pm M-F  °[facility] [address]    °[place], [place] 336-841-1104   °[facility] [address], [place], [place] 336-379-7146   °[facility] [address]  °[place], [place] 336-285-9073   °[facility] - Intensive Outpatient [address], [place], [place] 336-852-3033   °[facility] [address]  °[place], [place] 1-877-615-2722 or 336-784-9470   °[facility] [address] [place], [place] 336-227-7417 Accepts Medicaid  °[facility] [address]  °[place] 1-800-659-3381 Substance Abuse/Addiction Treatment  ° °[place] Behavioral Health Resources °Organization         Address  Phone  Notes  °[facility]  (888) 581-9988   °Julie Brannon, PhD [address] [place], [place]   (336) 349-5553 or (336) 951-0000   ° Behavioral   [address] °[place], [place] (336) 349-4454   °[facility] [address], [place], [place] (336) 342-8316 Insurance/Medicaid/sponsorship through [facility]  °[facility] [address]                                    [place], [place] (336) 342-8316 Therapy/tele-psych/case  °[facility] [address]  ° [place],  (336) 349-2233    °Dr. Arfeen  (336) 349-4544   °[facility] [place]  [employer] [place] [facility] 1) [address], [place] °2) [address], [place] °3)  [address], [place] (336) 349-3220 °(336) 342-7768 ° °(336) 342-8140   °[place] [facility]  (336) 342-1394 or (336) 342-3537 (After Hours)    ° ° ° °

## 2014-04-05 ENCOUNTER — Emergency Department (HOSPITAL_COMMUNITY)
Admission: EM | Admit: 2014-04-05 | Discharge: 2014-04-05 | Discharge status: Against Medical Advice | Payer: Self-pay | Attending: Emergency Medicine | Admitting: Emergency Medicine

## 2014-04-05 ENCOUNTER — Encounter (HOSPITAL_COMMUNITY): Payer: Self-pay | Admitting: *Deleted

## 2014-04-05 DIAGNOSIS — S8991XA Unspecified injury of right lower leg, initial encounter: Secondary | ICD-10-CM | POA: Insufficient documentation

## 2014-04-05 DIAGNOSIS — W1830XA Fall on same level, unspecified, initial encounter: Secondary | ICD-10-CM | POA: Insufficient documentation

## 2014-04-05 DIAGNOSIS — M791 Myalgia, unspecified site: Secondary | ICD-10-CM

## 2014-04-05 DIAGNOSIS — S0993XA Unspecified injury of face, initial encounter: Secondary | ICD-10-CM | POA: Insufficient documentation

## 2014-04-05 DIAGNOSIS — Y9389 Activity, other specified: Secondary | ICD-10-CM | POA: Insufficient documentation

## 2014-04-05 DIAGNOSIS — Y9289 Other specified places as the place of occurrence of the external cause: Secondary | ICD-10-CM | POA: Insufficient documentation

## 2014-04-05 DIAGNOSIS — Y998 Other external cause status: Secondary | ICD-10-CM | POA: Insufficient documentation

## 2014-04-05 DIAGNOSIS — Z720 Tobacco use: Secondary | ICD-10-CM | POA: Insufficient documentation

## 2014-04-05 DIAGNOSIS — S8992XA Unspecified injury of left lower leg, initial encounter: Secondary | ICD-10-CM | POA: Insufficient documentation

## 2014-04-05 HISTORY — DX: Bipolar disorder, unspecified: F31.9

## 2014-04-05 NOTE — ED Notes (Signed)
Called for room. No answer. Pt not found in waiting room.

## 2014-04-05 NOTE — ED Notes (Signed)
Generalized body pain, "my fibromyalgia is flaring up".  No relief with motrin,  Fell last night due to pain in knees.  Dental pain also.

## 2014-04-10 ENCOUNTER — Encounter (HOSPITAL_COMMUNITY): Payer: Self-pay | Admitting: Emergency Medicine

## 2014-04-10 ENCOUNTER — Emergency Department (HOSPITAL_COMMUNITY)
Admission: EM | Admit: 2014-04-10 | Discharge: 2014-04-10 | Disposition: A | Discharge status: Home / Self Care | Payer: Self-pay | Attending: Emergency Medicine | Admitting: Emergency Medicine

## 2014-04-10 DIAGNOSIS — F319 Bipolar disorder, unspecified: Secondary | ICD-10-CM | POA: Insufficient documentation

## 2014-04-10 DIAGNOSIS — R112 Nausea with vomiting, unspecified: Secondary | ICD-10-CM | POA: Insufficient documentation

## 2014-04-10 DIAGNOSIS — Z79899 Other long term (current) drug therapy: Secondary | ICD-10-CM | POA: Insufficient documentation

## 2014-04-10 DIAGNOSIS — I10 Essential (primary) hypertension: Secondary | ICD-10-CM | POA: Insufficient documentation

## 2014-04-10 DIAGNOSIS — K088 Other specified disorders of teeth and supporting structures: Secondary | ICD-10-CM | POA: Insufficient documentation

## 2014-04-10 DIAGNOSIS — Z8639 Personal history of other endocrine, nutritional and metabolic disease: Secondary | ICD-10-CM | POA: Insufficient documentation

## 2014-04-10 DIAGNOSIS — Z720 Tobacco use: Secondary | ICD-10-CM | POA: Insufficient documentation

## 2014-04-10 DIAGNOSIS — M797 Fibromyalgia: Secondary | ICD-10-CM | POA: Insufficient documentation

## 2014-04-10 DIAGNOSIS — F419 Anxiety disorder, unspecified: Secondary | ICD-10-CM | POA: Insufficient documentation

## 2014-04-10 DIAGNOSIS — K0889 Other specified disorders of teeth and supporting structures: Secondary | ICD-10-CM

## 2014-04-10 DIAGNOSIS — Z880 Allergy status to penicillin: Secondary | ICD-10-CM | POA: Insufficient documentation

## 2014-04-10 MED ORDER — OXYCODONE-ACETAMINOPHEN 5-325 MG PO TABS
2.0000 | ORAL_TABLET | Freq: Once | ORAL | Status: AC
  Administered 2014-04-10: 2 via ORAL
  Filled 2014-04-10: qty 2

## 2014-04-10 MED ORDER — HYDROCHLOROTHIAZIDE 25 MG PO TABS: 25.0000 mg | ORAL_TABLET | Freq: Every day | ORAL | Status: DC

## 2014-04-10 MED ORDER — OXYCODONE-ACETAMINOPHEN 10-325 MG PO TABS: 1.0000 | ORAL_TABLET | ORAL | Status: DC | PRN

## 2014-04-10 NOTE — ED Notes (Signed)
J. Idol, PA at bedside. 

## 2014-04-10 NOTE — ED Notes (Signed)
Pt verbalized understanding of no driving and to use caution within 4 hours of taking pain meds due to meds cause drowsiness 

## 2014-04-10 NOTE — ED Provider Notes (Signed)
CSN: [id number]     Arrival date & time 04/10/14  1104 History  This chart was scribed for non-physician practitioner, Burgess Amor, PA-C working with Doug Sou, MD by Gwenyth Ober, ED scribe. This patient was seen in room APFT22/APFT22 and the patient's care was started at 11:59 AM   Chief Complaint  Patient presents with  . Pain   The history is provided by the patient. No language interpreter was used.    HPI Comments: Sheila Horn is a 39 y.o. female with a history of HTN who presents to the Emergency Department complaining of a constant, 10/10, gradually worsening, acute-on-chronic flare up of fibromyalgia that started 3 days ago. She notes pain is worse in her bilateral knees, bilateral feet, back of her neck and lower back. Pt states nausea which accompanies her fibromyalgia flares as associated symptoms, denies vomiting. She also complains of pain over all 4 wisdom teeth. Pt notes that she over-exerted herself when her 2 children came to visit over the holiday and has felt pain since then. On an average day, pt rates pain as 5-7/10, but does not take any treatment for her symptoms. She states pain in her knees makes her fall and that she cannot do the things she needs to do at home because of her condition. Pt used to see Dr. Gerilyn Pilgrim for fibromyalgia, but has not seen him for 3 years because she lacks insurance. She was also taking Norvasc and Lisinopril for HTN, but has not taken medication in 5 years also due to lack of insurance or pcp. She denies shortness of breath, headache, chest pain, peripheral edema.  Pt does not work, but lives with her boyfriend who is employed. Pt is anxious about her condition and her inability to pay for treatment. She smokes cigarettes, but denies EtOH use. Pt denies fever, chills, injury.  She is tearful during her ed visit.  She denies SI.  Past Medical History  Diagnosis Date  . Fibromyalgia   . Thyroid disease     hypothyroidism  . Fibromyalgia    . Depression   . PTSD (post-traumatic stress disorder)   . Bipolar 1 disorder    Past Surgical History  Procedure Laterality Date  . Tonsillectomy    . Brain surgery     History reviewed. No pertinent family history. History  Substance Use Topics  . Smoking status: Current Every Day Smoker    Types: Cigarettes  . Smokeless tobacco: Not on file  . Alcohol Use: No   OB History    No data available     Review of Systems  Constitutional: Negative for fever.  HENT: Positive for dental problem.   Gastrointestinal: Positive for nausea and vomiting.  Musculoskeletal: Positive for arthralgias.  Psychiatric/Behavioral: The patient is nervous/anxious.   All other systems reviewed and are negative.  Allergies  Demerol; Ketorolac tromethamine; Penicillins; Prednisone; Tramadol; and Hydrocodone  Home Medications   Prior to Admission medications   Medication Sig Start Date End Date Taking? Authorizing Provider  ALPRAZolam Prudy Feeler) 1 MG tablet Take 1 mg by mouth 4 (four) times daily.    Historical Provider, MD  Aspirin-Salicylamide-Caffeine (BC HEADACHE POWDER PO) Take 1 packet by mouth as needed (pain).    Historical Provider, MD  FLUoxetine (PROZAC) 40 MG capsule Take 40 mg by mouth daily.    Historical Provider, MD  gabapentin (NEURONTIN) 300 MG capsule Take 1 capsule (300 mg total) by mouth 2 (two) times daily. 10/26/13   Teressa Lower, NP  hydrochlorothiazide (HYDRODIURIL) 25 MG tablet Take 1 tablet (25 mg total) by mouth daily. 04/10/14   Burgess Amor, PA-C  oxyCODONE-acetaminophen (PERCOCET) 10-325 MG per tablet Take 1 tablet by mouth every 4 (four) hours as needed for pain. 04/10/14   Burgess Amor, PA-C   BP 174/108 mmHg  Pulse 81  Temp(Src) 98.4 F (36.9 C) (Oral)  Resp 18  Ht  (1.676 m)  Wt 230 lb (104.327 kg)  BMI 37.14 kg/m2  SpO2 100%  LMP 04/04/2014 Physical Exam  Constitutional: She is oriented to person, place, and time. She appears well-developed and  well-nourished. No distress.  HENT:  Head: Normocephalic and atraumatic.  Mouth/Throat: Oropharynx is clear and moist. No oropharyngeal exudate.  Erupted and partially erupted 3rd molars without evidence for infection or abscess  Eyes: Pupils are equal, round, and reactive to light.  Neck: Neck supple.  Cardiovascular: Normal rate.   Pulmonary/Chest: Effort normal.  Musculoskeletal: She exhibits no edema.  Neurological: She is alert and oriented to person, place, and time. No cranial nerve deficit.  Skin: Skin is warm and dry. No rash noted.  Psychiatric: She has a normal mood and affect. Her behavior is normal.  Nursing note and vitals reviewed.   ED Course  Procedures (including critical care time) DIAGNOSTIC STUDIES: Oxygen Saturation is 100% on RA, normal by my interpretation.    COORDINATION OF CARE: 12:12 PM Discussed treatment plan with pt at bedside and pt agreed to plan.  Labs Review Labs Reviewed - No data to display  Imaging Review No results found.   EKG Interpretation None      MDM   Final diagnoses:  Fibromyalgia syndrome  Essential hypertension  Pain, dental    Pt without acute findings with history of chronic fibromyalgia pain.  No dental infection.  She was prescribed oxyocodoone for pain relief.  Also started her on hctz for blood pressure control.  She was given referrals for establishing care with pcp. Including the health dept who offers dental services.  I personally performed the services described in this documentation, which was scribed in my presence. The recorded information has been reviewed and is accurate.  The patient appears reasonably screened and/or stabilized for discharge and I doubt any other medical condition or other [facility] requiring further screening, evaluation, or treatment in the ED at this time prior to discharge.     Burgess Amor, PA-C 04/11/14 1356  Doug Sou, MD 04/11/14 1447

## 2014-04-10 NOTE — ED Notes (Addendum)
Pt c/o pain all over esp to knees due to fibromyalgia  "flare-up", states she has taken 10 BC powders and  ibuprofen at 0500 and again at 1000, states she has no insurance and has not seen [place] in 3 years and that she "deals with pain everyday"

## 2014-04-10 NOTE — Discharge Instructions (Signed)
Dental Pain A tooth ache may be caused by cavities (tooth decay). Cavities expose the nerve of the tooth to air and hot or cold temperatures. It may come from an infection or abscess (also called a boil or furuncle) around your tooth. It is also often caused by dental caries (tooth decay). This causes the pain you are having. DIAGNOSIS  Your caregiver can diagnose this problem by exam. TREATMENT   If caused by an infection, it may be treated with medications which kill germs (antibiotics) and pain medications as prescribed by your caregiver. Take medications as directed.  Only take over-the-counter or prescription medicines for pain, discomfort, or fever as directed by your caregiver.  Whether the tooth ache today is caused by infection or dental disease, you should see your dentist as soon as possible for further care. SEEK MEDICAL CARE IF: The exam and treatment you received today has been provided on an emergency basis only. This is not a substitute for complete medical or dental care. If your problem worsens or new problems (symptoms) appear, and you are unable to meet with your dentist, call or return to this location. SEEK IMMEDIATE MEDICAL CARE IF:   You have a fever.  You develop redness and swelling of your face, jaw, or neck.  You are unable to open your mouth.  You have severe pain uncontrolled by pain medicine. MAKE SURE YOU:   Understand these instructions.  Will watch your condition.  Will get help right away if you are not doing well or get worse. Document Released: 03/25/2005 Document Revised: 06/17/2011 Document Reviewed: 11/11/2007 [facility] Patient Information 2015 [place], [place]. This information is not intended to replace advice given to you by your health care provider. Make sure you discuss any questions you have with your health care provider.  Fibromyalgia Fibromyalgia is a disorder that is often misunderstood. It is associated with muscular pains and  tenderness that comes and goes. It is often associated with fatigue and sleep disturbances. Though it tends to be Jentsch-lasting, fibromyalgia is not life-threatening. CAUSES  The exact cause of fibromyalgia is unknown. People with certain gene types are predisposed to developing fibromyalgia and other conditions. Certain factors can play a role as triggers, such as:  Spine disorders.  Arthritis.  Severe injury (trauma) and other physical stressors.  Emotional stressors. SYMPTOMS   The main symptom is pain and stiffness in the muscles and joints, which can vary over time.  Sleep and fatigue problems. Other related symptoms may include:  Bowel and bladder problems.  Headaches.  Visual problems.  Problems with odors and noises.  Depression or mood changes.  Painful periods (dysmenorrhea).  Dryness of the skin or eyes. DIAGNOSIS  There are no specific tests for diagnosing fibromyalgia. Patients can be diagnosed accurately from the specific symptoms they have. The diagnosis is made by determining that nothing else is causing the problems. TREATMENT  There is no cure. Management includes medicines and an active, healthy lifestyle. The goal is to enhance physical fitness, decrease pain, and improve sleep. HOME CARE INSTRUCTIONS   Only take over-the-counter or prescription medicines as directed by your caregiver. Sleeping pills, tranquilizers, and pain medicines may make your problems worse.  Low-impact aerobic exercise is very important and advised for treatment. At first, it may seem to make pain worse. Gradually increasing your tolerance will overcome this feeling.  Learning relaxation techniques and how to control stress will help you. Biofeedback, visual imagery, hypnosis, muscle relaxation, yoga, and meditation are all options.  Anti-inflammatory medicines and physical therapy may provide short-term help.  Acupuncture or massage treatments may help.  Take muscle relaxant  medicines as suggested by your caregiver.  Avoid stressful situations.  Plan a healthy lifestyle. This includes your diet, sleep, rest, exercise, and friends.  Find and practice a hobby you enjoy.  Join a fibromyalgia support group for interaction, ideas, and sharing advice. This may be helpful. SEEK MEDICAL CARE IF:  You are not having good results or improvement from your treatment. FOR MORE INFORMATION  Hypertension Hypertension is another name for high blood pressure. High blood pressure forces your heart to work harder to pump blood. A blood pressure reading has two numbers, which includes a higher number over a lower number (example: 110/72). HOME CARE   Have your blood pressure rechecked by your doctor.  Only take medicine as told by your doctor. Follow the directions carefully. The medicine does not work as well if you skip doses. Skipping doses also puts you at risk for problems.  Do not smoke.  Monitor your blood pressure at home as told by your doctor. GET HELP IF:  You think you are having a reaction to the medicine you are taking.  You have repeat headaches or feel dizzy.  You have puffiness (swelling) in your ankles.  You have trouble with your vision. GET HELP RIGHT AWAY IF:   You get a very bad headache and are confused.  You feel weak, numb, or faint.  You get chest or belly (abdominal) pain.  You throw up (vomit).  You cannot breathe very well. MAKE SURE YOU:   Understand these instructions.  Will watch your condition.  Will get help right away if you are not doing well or get worse. Document Released: 09/11/2007 Document Revised: 03/30/2013 Document Reviewed: 01/15/2013 [facility] Patient Information 2015 [employer], [place]. This information is not intended to replace advice given to you by your health care provider. Make sure you discuss any questions you have with your health care provider.   Emergency Department Resource Guide 1) Find a Doctor  and Pay Out of Pocket Although you won't have to find out who is covered by your insurance plan, it is a good idea to ask around and get recommendations. You will then need to call the office and see if the doctor you have chosen will accept you as a new patient and what types of options they offer for patients who are self-pay. Some doctors offer discounts or will set up payment plans for their patients who do not have insurance, but you will need to ask so you aren't surprised when you get to your appointment.  2) Contact Your Local Health Department Not all health departments have doctors that can see patients for sick visits, but many do, so it is worth a call to see if yours does. If you don't know where your local health department is, you can check in your phone book. The CDC also has a tool to help you locate your state's health department, and many state websites also have listings of all of their local health departments.  3) Find a Walk-in Clinic If your illness is not likely to be very severe or complicated, you may want to try a walk in clinic. These are popping up all over the country in pharmacies, drugstores, and shopping centers. They're usually staffed by nurse practitioners or physician assistants that have been trained to treat common illnesses and complaints. They're usually fairly quick and inexpensive. However, if  you have serious medical issues or chronic medical problems, these are probably not your best option.  No Primary Care Doctor: - Call Health Connect at  [phone] - they can help you locate a primary care doctor that  accepts your insurance, provides certain services, etc. - Physician Referral Service- [phone]  Chronic Pain Problems: Organization         Address  Phone   Notes  [facility]  [phone] Patients need to be referred by their primary care doctor.   Medication Assistance: Organization         Address  Phone    Notes  [facility] Medication [facility] [address] [place], [place] [address] [phone] --Must be a resident of [facility] -- Must have NO insurance coverage whatsoever (no Medicaid/ Medicare, etc.) -- The pt. MUST have a primary care doctor that directs their care regularly and follows them in the community   MedAssist  [phone]   [employer]  [phone]    Agencies that provide inexpensive medical care: Organization         Address  Phone   Notes  [place]  [phone]   [facility]    [phone]   [facility] [place], [place] [address] [phone]   [place] [address] [place] [phone]   Planned Parenthood    [phone]   [facility]    [phone]   [place] and [address], [place] Phone:  [phone], Fax:  [phone] Hours of Operation:  9 am - 6 pm, M-F.  Also accepts Medicaid/Medicare and self-pay.  [facility] for [place], [address], [place] Phone: [phone], Fax: [phone]. Hours of Operation:  8:30 am - 5:30 pm, M-F.  Also accepts Medicaid and self-pay.  [facility] [address], [place] Phone: [phone]   [place], [place], [place] [phone], Ext. [phone] Mondays & Thursdays: 7-9 AM.  First 15 patients are seen on a first come, first serve basis.    [place] Providers:  Organization         Address  Phone   Notes  [facility] [address], [place] [phone] Also accepts self-pay patients.  [facility] [address], [place]  [phone]   [place], [address], [place] [phone]   [facility] [address] [place] [phone]   Lucianne Lei [address], [place]   [phone] Only accepts [place] Access [place] patients after they have their name applied to their card.   Self-Pay (no insurance) in [facility]:  Organization         Address  Phone   Notes  Sickle Cell Patients, [facility] [place] [phone]   [facility] [place] [phone]   [facility] [place]  [place], [place], [place] [phone]   [facility]/Dr. Osei-Bonsu  [address] [place] or [address], [place] [phone] Phone number for both [facility] and   locations is the same.  [facility] [address] [place] [phone]   [facility] [address] [place] or [address] [phone]   [facility] [address], [place] [phone], phone; [phone], fax Sees patients 1st and 3rd Saturday of every month.  Must not qualify for public or private insurance (i.e. Medicaid, Medicare, [place] Health Choice, Veterans' Benefits)  Household income should be no more than 200% of the poverty level The clinic cannot treat you if you are pregnant or think you are pregnant  Sexually transmitted diseases are not treated at the clinic.    Dental Care: Organization         Address  Phone  Notes  [facility] Department of [facility] [place] [phone] Accepts children up to age 65 who are enrolled in [place] or [place]; pregnant women with a Medicaid card; and children who have applied for Medicaid or [place] Health Choice, but were declined, whose parents can pay a reduced fee at time of service.  [facility] Department of [facility]  [address], [place] [phone] Accepts children up to age 65 who are enrolled in [place] or [place]; pregnant women with a Medicaid card; and children who have applied for Medicaid or [place] Health Choice, but were declined, whose parents can pay a reduced fee at time of service.  [place] [facility] PRO[facility]  [place] [phone] Patients are seen by appointment only. Walk-ins are not accepted. [place] will see patients 85 years of age and older. Monday - Tuesday (8am-5pm) Most Wednesdays (8:30-5pm) $30 per visit, cash only  [facility] Adult Dental Access PROGRAM  [address], [facility] [phone] Patients are seen by appointment only. Walk-ins are not accepted. [place] will see patients 24 years of age and older. One Wednesday Evening (Monthly: Volunteer Based).  $30 per visit, cash only  [place]  [phone] for adults; Children under age 41, call Graduate Pediatric Dentistry at [phone]. Children aged 70-14, please call [phone] to request a pediatric application.  Dental services are provided in all areas of dental care including fillings, crowns and bridges, complete and partial dentures, implants, gum treatment, root canals, and extractions. Preventive care is also provided. Treatment is provided to both adults and children. Patients are selected via a lottery and there is often a waiting list.   [facility] [address] [place]  [phone] www.drcivils.com   [facility] [address] [place], [place] [phone], Ext. [phone] Second and Fourth Thursday of each month, opens at 6:30 AM; Clinic ends at 9 AM.  Patients are seen on a first-come first-served basis, and a limited number are seen during each clinic.   [facility]  [address] [place] Danker [place], [place] [phone]   Eligibility Requirements You must have lived in  [place], [place], or [place] counties for at least the last three months.   You cannot be eligible for state or federal sponsored [employer], including [employer], [place], or [employer].   You generally cannot be eligible for healthcare insurance through your employer.    How to apply: Eligibility screenings are held every Tuesday and Wednesday afternoon from 1:00 pm until 4:00 pm. You do not  need an appointment for the interview!  [facility] [address] [place], [place]   [place]  [place] Department  [place]  [phone]    Behavioral Health Resources in the Community: Intensive Outpatient Programs Organization         Address  Phone  Notes  [place]. [address] [place], [place] [phone]   [facility] [address] [place], [place]   ADS: Alcohol & Drug Svcs [address], [place], [place]   [place] [address]  [place], [place] or [phone]   Substance Abuse Resources Organization         Address  Phone  Notes  Alcohol and Drug Services  [phone]   [place]  [phone]   [facility] [place]   Chinita Pester  [phone]   Residential & Outpatient Substance Abuse Program  [phone]   Psychological Services Organization         Address  Phone  Notes  [facility] [place]  [place]  [phone]   [place] [address] [place] or [phone]    Mobile Crisis Teams Organization         Address  Phone  Notes  [facility], Mobile Crisis Care Unit  [phone]   [facility]otherapeutic Services  [address] [place], [place]   [place] [address], [place] [phone]    Self-Help/Support Groups Organization         Address  Phone             Notes  [place]. of [place] - variety of support groups  [place] Call for more information  Narcotics Anonymous (NA), [facility] [address], [employer] [place]  2 meetings at this location   Special educational needs teacher         Address  Phone  Notes  [facility] [place],    [place]  [phone]   [facility]  [address] [place] [phone], [place], [place]   [place], [place] [phone] Admissions: 8am-3pm M-F  Incentives Substance [place] [address]    [place], [place] [phone]   [facility] [address] [place], [place], [place]   [facility] [address]  [place], [place]   [facility] - Intensive Outpatient [address], [place], [place]   [facility] ([place].) [place].,  [place], [place] [phone] or [phone]   [facility] Services (RTS) [address], [place], [place] Accepts Medicaid  [facility] [address]  [place] [phone] Substance Abuse/Addiction Treatment   [facility] Organization         Address  Phone  Notes  [facility]  [phone]   Domenic Schwab, PhD [address] Arlis Porta [place], [place]   [phone] or [phone]   [place], [place] [phone]   [place] [address] [place], [place] [phone] Insurance/Medicaid/sponsorship through [employer] and Families [address], [place] [address]  [place], [place] [phone] Therapy/tele-psych/case  [facility] [address]   [place], [place] [phone]    Dr. Lolly Mustache  [phone]   [facility] [place]  [employer] [facility] Dept. 1) [address], [place] 2) [address] [place] 3)  [address], [place] [phone]  [phone]   [facility] Child Abuse Hotline 240 036 7975 or [phone] (After Hours)         You may take the oxycodone prescribed for pain relief.  This will make you drowsy - do not drive within 4 hours of taking this medication.   National Fibromyalgia Association: www.fmaware.org Arthritis Foundation: www.arthritis.org Document Released: 03/25/2005 Document Revised: 06/17/2011 Document Reviewed: 07/05/2009 [facility] Patient Information 2015 [place], [place]. This information is not intended to replace advice given to you by your health care provider. Make sure you discuss any questions you have with your health care provider.

## 2014-04-10 NOTE — ED Notes (Signed)
Pt reports dental pain and fibromyalgia "flare-up" for last several days. nad noted.

## 2014-04-10 NOTE — ED Notes (Signed)
Assisted pt to [facility]

## 2014-04-18 ENCOUNTER — Emergency Department (HOSPITAL_COMMUNITY)
Admission: EM | Admit: 2014-04-18 | Discharge: 2014-04-18 | Disposition: A | Discharge status: Home / Self Care | Payer: Self-pay | Attending: Emergency Medicine | Admitting: Emergency Medicine

## 2014-04-18 ENCOUNTER — Encounter (HOSPITAL_COMMUNITY): Payer: Self-pay | Admitting: *Deleted

## 2014-04-18 DIAGNOSIS — K029 Dental caries, unspecified: Secondary | ICD-10-CM | POA: Insufficient documentation

## 2014-04-18 DIAGNOSIS — F319 Bipolar disorder, unspecified: Secondary | ICD-10-CM | POA: Insufficient documentation

## 2014-04-18 DIAGNOSIS — K088 Other specified disorders of teeth and supporting structures: Secondary | ICD-10-CM | POA: Insufficient documentation

## 2014-04-18 DIAGNOSIS — Z79899 Other long term (current) drug therapy: Secondary | ICD-10-CM | POA: Insufficient documentation

## 2014-04-18 DIAGNOSIS — Z7982 Long term (current) use of aspirin: Secondary | ICD-10-CM | POA: Insufficient documentation

## 2014-04-18 DIAGNOSIS — Z720 Tobacco use: Secondary | ICD-10-CM | POA: Insufficient documentation

## 2014-04-18 DIAGNOSIS — M797 Fibromyalgia: Secondary | ICD-10-CM | POA: Insufficient documentation

## 2014-04-18 DIAGNOSIS — K0889 Other specified disorders of teeth and supporting structures: Secondary | ICD-10-CM

## 2014-04-18 DIAGNOSIS — G43909 Migraine, unspecified, not intractable, without status migrainosus: Secondary | ICD-10-CM | POA: Insufficient documentation

## 2014-04-18 DIAGNOSIS — Z880 Allergy status to penicillin: Secondary | ICD-10-CM | POA: Insufficient documentation

## 2014-04-18 DIAGNOSIS — Z8639 Personal history of other endocrine, nutritional and metabolic disease: Secondary | ICD-10-CM | POA: Insufficient documentation

## 2014-04-18 HISTORY — DX: Migraine, unspecified, not intractable, without status migrainosus: G43.909

## 2014-04-18 MED ORDER — CLINDAMYCIN HCL 150 MG PO CAPS: 150.0000 mg | ORAL_CAPSULE | Freq: Four times a day (QID) | ORAL | Status: DC

## 2014-04-18 MED ORDER — OXYCODONE-ACETAMINOPHEN 5-325 MG PO TABS: 1.0000 | ORAL_TABLET | ORAL | Status: DC | PRN

## 2014-04-18 MED ORDER — OXYCODONE-ACETAMINOPHEN 5-325 MG PO TABS
1.0000 | ORAL_TABLET | Freq: Once | ORAL | Status: AC
  Administered 2014-04-18: 1 via ORAL
  Filled 2014-04-18: qty 1

## 2014-04-18 MED ORDER — CLINDAMYCIN HCL 150 MG PO CAPS
300.0000 mg | ORAL_CAPSULE | Freq: Once | ORAL | Status: AC
  Administered 2014-04-18: 300 mg via ORAL
  Filled 2014-04-18: qty 2

## 2014-04-18 NOTE — ED Notes (Signed)
Dental pain , says that her wisdom teeth at coming in.  Says she has a headache, and fibromyalgia.

## 2014-04-18 NOTE — Discharge Instructions (Signed)
Dental Pain °Toothache is pain in or around a tooth. It may get worse with chewing or with cold or heat.  °HOME CARE °· Your dentist may use a numbing medicine during treatment. If so, you may need to avoid eating until the medicine wears off. Ask your dentist about this. °· Only take medicine as told by your dentist or doctor. °· Avoid chewing food near the painful tooth until after all treatment is done. Ask your dentist about this. °GET HELP RIGHT AWAY IF:  °· The problem gets worse or new problems appear. °· You have a fever. °· There is redness and puffiness (swelling) of the face, jaw, or neck. °· You cannot open your mouth. °· There is pain in the jaw. °· There is very bad pain that is not helped by medicine. °MAKE SURE YOU:  °· Understand these instructions. °· Will watch your condition. °· Will get help right away if you are not doing well or get worse. °Document Released: 09/11/2007 Document Revised: 06/17/2011 Document Reviewed: 09/11/2007 °ExitCare® Patient Information ©2015 [employer]. This information is not intended to replace advice given to you by your health care provider. Make sure you discuss any questions you have with your health care provider. ° ° ° °Emergency Department Resource Guide °1) Find a Doctor and Pay Out of Pocket °Although you won't have to find out who is covered by your insurance plan, it is a good idea to ask around and get recommendations. You will then need to call the office and see if the doctor you have chosen will accept you as a new patient and what types of options they offer for patients who are self-pay. Some doctors offer discounts or will set up payment plans for their patients who do not have insurance, but you will need to ask so you aren't surprised when you get to your appointment. ° °2) Contact Your Local Health Department °Not all health departments have doctors that can see patients for sick visits, but many do, so it is worth a call to see if yours does.  If you don't know where your local health department is, you can check in your phone book. The CDC also has a tool to help you locate your state's health department, and many state websites also have listings of all of their local health departments. ° °3) Find a Walk-in Clinic °If your illness is not likely to be very severe or complicated, you may want to try a walk in clinic. These are popping up all over the country in pharmacies, drugstores, and shopping centers. They're usually staffed by nurse practitioners or physician assistants that have been trained to treat common illnesses and complaints. They're usually fairly quick and inexpensive. However, if you have serious medical issues or chronic medical problems, these are probably not your best option. ° °No Primary Care Doctor: °- Call Health Connect at  832-8000 - they can help you locate a primary care doctor that  accepts your insurance, provides certain services, etc. °- Physician Referral Service- 1-800-533-3463 ° °Chronic Pain Problems: °Organization         Address  Phone   Notes  °[facility]  (336) 297-2271 Patients need to be referred by their primary care doctor.  ° °Medication Assistance: °Organization         Address  Phone   Notes  °[place] [facility] [address] °[place], [place] [address] (336) 641-8030 --Must be a resident   of [place] °-- Must have NO insurance coverage whatsoever (no Medicaid/ Medicare, etc.) °-- The pt. MUST have a primary care doctor that directs their care regularly and follows them in the community °  °[facility]  (866) 331-1348   °[employer]  (888) 892-1162   ° °Agencies that provide inexpensive medical care: °Organization         Address  Phone   Notes  °[facility]  (336) 832-8035   °[facility]    (336) 832-7272   °[facility] [address] °[place], [place] [address] (336) 832-4777   °[facility] [address], °[place] (336) 271-4999   °[facility]    (336) 373-0678   °[facility]    (336) 272-1050   °[facility] ° [address], [place] Phone:  (336) 832-4444, Fax:  (336) 832-4440 Hours of Operation:  9 am - 6 pm, M-F.  Also accepts Medicaid/Medicare and self-pay.  °[facility] ° [address], [place] Phone: (336) 832-3150, Fax: (336) 832-3151. Hours of Operation:  8:30 am - 5:30 pm, M-F.  Also accepts Medicaid and self-pay.  °[facility] [place] [address], [place] Phone: (336) 878-6027   °[facility] [address], [place], [place] (336)723-1848, Ext. 123 Mondays & Thursdays: 7-9 AM.  First 15 patients are seen on a first come, first serve basis. °  ° °Medicaid-accepting [place] Providers: ° °Organization         Address  Phone   Notes  °[facility] [address], [place] (336) 641-2100 Also accepts self-pay patients.  °[facility] [address], [place] ° (336) 856-9996   °[facility] [address], [place] (336) 288-8857   °[facility] [address] [place] [address], [place] (336) 299-7000   °Veita Bland [address], [place]  ° (336) 373-1557 Only accepts [place] Access Medicaid patients after they have their name applied to their card.  ° °Self-Pay (no insurance) in [place]: ° °Organization         Address  Phone   Notes  °Sickle Cell Patients, [facility] [address], [place] (336) 832-1970   °[facility] [address], [place] (336) 832-4400   ° [facility] [place] ° [address], [place] (336) 992-4800   °[facility]/Dr. Osei-Bonsu ° [address] [place] [address], [place] or [address], [place] (336) 841-8500  Phone number for both [place] and [place] locations is the same.  °[facility] [address], [place] (336) 299-0000   °[facility]  [address] [place] [address],  or [address] (336) 852-7530 °(336) 878-2260   °[facility] [address],  (336) 350-1642, phone; (336) 294-5005, fax Sees patients 1st and 3rd Saturday of every month.  Must not qualify for public or private insurance (i.e. Medicaid, Medicare, [place] Health Choice, Veterans' Benefits) • Household income should be no more than 200% of the poverty level •The clinic cannot treat you if you are pregnant or think you are pregnant • Sexually transmitted diseases are not treated at the clinic.  ° ° °Dental Care: °Organization         Address    Phone  Notes  °[facility] [facility] [address], [place] (336) 641-6152 Accepts children up to age 21 who are enrolled in Medicaid or [place] Health Choice; pregnant women with a Medicaid card; and children who have applied for Medicaid or [place] Health Choice, but were declined, whose parents can pay a reduced fee at time of service.  °[facility] [place]  [address], [place] (336) 641-7733 Accepts children up to age 21 who are enrolled in Medicaid or [place] Health Choice; pregnant women with a Medicaid card; and children who have applied for Medicaid or [place] Health Choice, but were declined, whose parents can pay a reduced fee at time of service.  °[facility] ° [address], [place] (336) 641-4533 Patients are seen by appointment only. Walk-ins are not accepted. [facility] will see patients 18 years of age and older. °Monday - Tuesday (8am-5pm) °Most Wednesdays (8:30-5pm) °$30 per visit, cash only  °[facility] ° [address], [place] (336) 641-4533 Patients are seen by appointment  only. Walk-ins are not accepted. [facility] will see patients 18 years of age and older. °One Wednesday Evening (Monthly: Volunteer Based).  $30 per visit, cash only  °[facility]  (919) 537-3737 for adults; Children under age 4, call Graduate Pediatric Dentistry at (919) [phone]. Children aged 4-14, please call (919) 537-3737 to request a pediatric application. ° Dental services are provided in all areas of dental care including fillings, crowns and bridges, complete and partial dentures, implants, gum treatment, root canals, and extractions. Preventive care is also provided. Treatment is provided to both adults and children. °Patients are selected via a lottery and there is often a waiting list. °  °[facility] [address], °[place] ° (336) 763-8833 [email or web address].[email or web address] °  °[facility] [address], [place], [place] (336)723-1848, Ext. 123 Second and Fourth Thursday of each month, opens at 6:30 AM; Clinic ends at 9 AM.  Patients are seen on a first-come first-served basis, and a limited number are seen during each clinic.  ° °[facility] ° [address], [place], [place] (336) 723-7904   Eligibility Requirements °You must have lived in [place], [place], or [place] counties for at least the last three months. °  You cannot be eligible for state or federal sponsored healthcare insurance, including Veterans Administration, Medicaid, or Medicare. °  You generally cannot be eligible for healthcare insurance through your employer.  °  How to apply: °Eligibility screenings are held every Tuesday and Wednesday afternoon from 1:00 pm until 4:00 pm. You do not need an appointment for the interview!  °[facility] [address], [place], [place] 336-631-2330   °[place] [facility]  336-342-8273   °[place]  336-703-3100   ° [facility]  336-570-6415   ° °Behavioral Health  Resources in the Community: °Intensive Outpatient Programs °Organization         Address  Phone  Notes  °[facility] [address], [place], [place] 336-878-6098   °[facility] [address], [place], [place] 336-832-9800   °[facility] [address], [place], [place] ° 336-882-2125   °[facility] [address],  °[place], [place] 1-800-853-5163 or 336-641-4981   °Substance Abuse Resources °Organization           Address  Phone  Notes  °[facility]  336-882-2125   °[facility]  336-784-9470   °[facility]  336-285-9073   °[facility]  336-845-3988   °Residential & Outpatient Substance Abuse Program  1-800-659-3381   °Psychological Services °Organization         Address  Phone  Notes  °[facility]  336- 832-9600   °[facility]  336- 378-7881   °[facility] [address], [place] 1-800-853-5163 or 336-641-4981   ° °Mobile Crisis Teams °Organization         Address  Phone  Notes  °[facility], Mobile Crisis Care Unit  1-877-626-1772   °Assertive °[facility] ° [address] [place], [place] 336-834-9664   °Sharon DeEsch [address] °[place] 336-554-5454   ° °Self-Help/Support Groups °Organization         Address  Phone             Notes  °[facility] [place] - variety of support groups  336- 373-1402 Call for more information  °Narcotics Anonymous (NA), [facility] [address], °[place]  2 meetings at this location  ° °Residential Treatment Programs °Organization         Address  Phone  Notes  °[facility] [address],    °[place]  1-866-801-8205   °[facility] ° [address], [place], [place] 704-293-8524   °[facility] [address], [place] 336-845-3988 Admissions: 8am-3pm M-F  °[facility] [address]    °[place], [place] 336-841-1104   °[facility] [address], [place], [place] 336-379-7146   °[facility] [address]  °[place], [place] 336-285-9073   °[facility] Intensive Outpatient [address], [place], [place] 336-852-3033   °[facility] [address]  °[place], [place] 1-877-615-2722 or 336-784-9470   °[facility] ([facility] [address] , [place] 336-227-7417 Accepts Medicaid  °[facility] [address]  ° [place] 1-800-659-3381 Substance Abuse/Addiction Treatment  ° °[place] Behavioral Health Resources °Organization         Address  Phone  Notes  °[facility]  (888) 581-9988   °Julie Brannon, PhD [address] [place], [place]   (336) 349-5553 or (336) 951-0000   °[facility]   [address] °[place], [place] (336) 349-4454   °[facility] [address], [place], [place] (336) 342-8316 Insurance/Medicaid/sponsorship through [facility]  °[facility] [address]                                    [place], [place] (336) 342-8316 Therapy/tele-psych/case  °[facility] [address]  ° [place], [place] (336) 349-2233    °Dr. Arfeen  (336) 349-4544   °[facility] [place]  [employer] [place] [facility] 1) [address], [place] °2) [address], [place] °3)  [address], [place] (336) 349-3220 °(336) 342-7768 ° °(336) 342-8140   °[place] [facility] (336) 342-1394 or (336) 342-3537 (After Hours)    ° °  °

## 2014-04-18 NOTE — Care Management Note (Signed)
ED/CM noted patient did not have health insurance and/or PCP listed in the computer.  Patient was given the [place] resource handout with information on the clinics, food pantries, and the handout for new health insurance sign-up.  Pt then states her PCP is Dr.Niemeyer, but she would like to change doctor's to one in [place] Shown the number on the handout for [facility], who will assist with this.  Patient expressed appreciation for information received.  

## 2014-04-18 NOTE — ED Provider Notes (Signed)
CSN: [id number]     Arrival date & time 04/18/14  1020 History  This chart was scribed for Sheila Aus, PA-C working with Vanetta Mulders, MD, by Ronney Lion, ED Scribe. This patient was seen in room APFT22/APFT22 and the patient's care was started at 11:48 AM.   Chief Complaint  Patient presents with  . Dental Pain   Patient is a 39 y.o. female presenting with tooth pain. The history is provided by the patient. No language interpreter was used.  Dental Pain Location:  Upper and lower Upper teeth location:  1/RU 3rd molar and 16/LU 3rd molar Lower teeth location:  17/LL 3rd molar and 32/RL 3rd molar Severity:  Severe Duration:  2 weeks Timing:  Constant Progression:  Worsening Chronicity:  New Associated symptoms: headaches   Associated symptoms: no congestion, no facial swelling, no fever and no neck pain      HPI Comments: Sheila Horn is a 39 y.o. female who presents to the Emergency Department for constant dental pain for the last 2 weeks. Patient also complains of constant head pain behind her eyes. She had been to the health department, where she was told her teeth have to be removed. [facility] told her they can pull one for free, but additional one would cost $100 each without insurance, which patient states is too expensive. Patient states that she was only prescribed pain medication; no antibiotics. She denies fever, facial swelling, difficulty swallowing or breathing.    Past Medical History  Diagnosis Date  . Fibromyalgia   . Thyroid disease     hypothyroidism  . Fibromyalgia   . Depression   . PTSD (post-traumatic stress disorder)   . Bipolar 1 disorder   . Migraine    Past Surgical History  Procedure Laterality Date  . Tonsillectomy    . Brain surgery     History reviewed. No pertinent family history. History  Substance Use Topics  . Smoking status: Current Every Day Smoker    Types: Cigarettes  . Smokeless tobacco: Not on file  . Alcohol Use: No   OB History     No data available     Review of Systems  Constitutional: Negative for fever and appetite change.  HENT: Positive for dental problem. Negative for congestion, facial swelling, sore throat and trouble swallowing.   Eyes: Negative for pain and visual disturbance.  Musculoskeletal: Negative for neck pain and neck stiffness.  Neurological: Positive for headaches. Negative for dizziness and facial asymmetry.  Hematological: Negative for adenopathy.  All other systems reviewed and are negative.     Allergies  Demerol; Ketorolac tromethamine; Penicillins; Prednisone; Tramadol; and Hydrocodone  Home Medications   Prior to Admission medications   Medication Sig Start Date End Date Taking? Authorizing Provider  acetaminophen (TYLENOL) 500 MG tablet Take 1,000 mg by mouth every 6 (six) hours as needed for moderate pain.   Yes Historical Provider, MD  ALPRAZolam Prudy Feeler) 1 MG tablet Take 1 mg by mouth 4 (four) times daily.   Yes Historical Provider, MD  Aspirin-Salicylamide-Caffeine (BC HEADACHE POWDER PO) Take 1 packet by mouth as needed (pain).   Yes Historical Provider, MD  FLUoxetine (PROZAC) 40 MG capsule Take 40 mg by mouth daily.   Yes Historical Provider, MD  hydrochlorothiazide (HYDRODIURIL) 25 MG tablet Take 1 tablet (25 mg total) by mouth daily. 04/10/14  Yes Raynelle Fanning Idol, PA-C  ibuprofen (ADVIL,MOTRIN) 200 MG tablet Take 400 mg by mouth every 6 (six) hours as needed for moderate pain.  Yes Historical Provider, MD  Multiple Vitamin (MULTIVITAMIN WITH MINERALS) TABS tablet Take 1 tablet by mouth daily.   Yes Historical Provider, MD  gabapentin (NEURONTIN) 300 MG capsule Take 1 capsule (300 mg total) by mouth 2 (two) times daily. Patient not taking: Reported on 04/18/2014 10/26/13   Teressa LowerVrinda Pickering, NP  oxyCODONE-acetaminophen (PERCOCET) 10-325 MG per tablet Take 1 tablet by mouth every 4 (four) hours as needed for pain. Patient not taking: Reported on 04/18/2014 04/10/14   Burgess AmorJulie Idol, PA-C     BP 131/86 mmHg  Pulse 107  Temp(Src) 98.1 F (36.7 C) (Oral)  Resp 16  Ht 5\' 5"  (1.651 m)  Wt 220 lb (99.791 kg)  BMI 36.61 kg/m2  SpO2 99%  LMP 04/14/2014 Physical Exam  Constitutional: She is oriented to person, place, and time. She appears well-developed and well-nourished. No distress.  HENT:  Head: Normocephalic and atraumatic.  Right Ear: Tympanic membrane and ear canal normal.  Left Ear: Tympanic membrane and ear canal normal.  Mouth/Throat: Uvula is midline, oropharynx is clear and moist and mucous membranes are normal. No trismus in the jaw. Dental caries present. No dental abscesses or uvula swelling.    Impacted 3rd molars, upper and lower. Dental decay of the right lower 2nd molar. No abscess.  Eyes: Conjunctivae and EOM are normal.  Neck: Normal range of motion. Neck supple. No tracheal deviation present.  Cardiovascular: Normal rate, regular rhythm and normal heart sounds.   No murmur heard. Pulmonary/Chest: Effort normal and breath sounds normal. No respiratory distress.  Musculoskeletal: Normal range of motion.  Lymphadenopathy:    She has no cervical adenopathy.  Neurological: She is alert and oriented to person, place, and time. She exhibits normal muscle tone. Coordination normal.  Skin: Skin is warm and dry.  Psychiatric: She has a normal mood and affect. Her behavior is normal.  Nursing note and vitals reviewed.   ED Course  Procedures (including critical care time)  DIAGNOSTIC STUDIES: Oxygen Saturation is 99% on room air, normal by my interpretation.    COORDINATION OF CARE: 11:51 AM - Discussed treatment plan with pt at bedside, and pt agreed to plan.    Labs Review Labs Reviewed - No data to display  Imaging Review No results found.   EKG Interpretation None      MDM   Final diagnoses:  Pain, dental   Pt with dental pain likely related to impacted molars.  No concerning sx's for dental abscess or Ludwig's angina.  Advised pt that  she will need to make arrangements for further care with a dentist.  Referral info given.    I personally performed the services described in this documentation, which was scribed in my presence. The recorded information has been reviewed and is accurate.     Tyon Cerasoli L. Trisha Mangleriplett, PA-C 04/19/14 [id number]  Vanetta MuldersScott Zackowski, MD 04/20/14 [phone]

## 2014-04-23 ENCOUNTER — Emergency Department (HOSPITAL_COMMUNITY)
Admission: EM | Admit: 2014-04-23 | Discharge: 2014-04-23 | Disposition: A | Discharge status: Home / Self Care | Payer: Self-pay | Attending: Emergency Medicine | Admitting: Emergency Medicine

## 2014-04-23 ENCOUNTER — Encounter (HOSPITAL_COMMUNITY): Payer: Self-pay | Admitting: Cardiology

## 2014-04-23 DIAGNOSIS — Z720 Tobacco use: Secondary | ICD-10-CM | POA: Insufficient documentation

## 2014-04-23 DIAGNOSIS — Z7982 Long term (current) use of aspirin: Secondary | ICD-10-CM | POA: Insufficient documentation

## 2014-04-23 DIAGNOSIS — Z8639 Personal history of other endocrine, nutritional and metabolic disease: Secondary | ICD-10-CM | POA: Insufficient documentation

## 2014-04-23 DIAGNOSIS — G8929 Other chronic pain: Secondary | ICD-10-CM | POA: Insufficient documentation

## 2014-04-23 DIAGNOSIS — Z79899 Other long term (current) drug therapy: Secondary | ICD-10-CM | POA: Insufficient documentation

## 2014-04-23 DIAGNOSIS — M797 Fibromyalgia: Secondary | ICD-10-CM | POA: Insufficient documentation

## 2014-04-23 DIAGNOSIS — G43909 Migraine, unspecified, not intractable, without status migrainosus: Secondary | ICD-10-CM | POA: Insufficient documentation

## 2014-04-23 DIAGNOSIS — Z880 Allergy status to penicillin: Secondary | ICD-10-CM | POA: Insufficient documentation

## 2014-04-23 DIAGNOSIS — K089 Disorder of teeth and supporting structures, unspecified: Secondary | ICD-10-CM

## 2014-04-23 DIAGNOSIS — K029 Dental caries, unspecified: Secondary | ICD-10-CM | POA: Insufficient documentation

## 2014-04-23 DIAGNOSIS — F319 Bipolar disorder, unspecified: Secondary | ICD-10-CM | POA: Insufficient documentation

## 2014-04-23 MED ORDER — OXYCODONE-ACETAMINOPHEN 10-325 MG PO TABS: 1.0000 | ORAL_TABLET | Freq: Three times a day (TID) | ORAL | Status: DC | PRN

## 2014-04-23 MED ORDER — OXYCODONE-ACETAMINOPHEN 5-325 MG PO TABS
1.0000 | ORAL_TABLET | Freq: Once | ORAL | Status: AC
  Administered 2014-04-23: 1 via ORAL
  Filled 2014-04-23: qty 1

## 2014-04-23 NOTE — ED Notes (Signed)
Pt reports a headache, tooth pain and a fibromyalgia flare over the past couple of days.

## 2014-04-23 NOTE — Discharge Instructions (Signed)
Dental Pain °A tooth ache may be caused by cavities (tooth decay). Cavities expose the nerve of the tooth to air and hot or cold temperatures. It may come from an infection or abscess (also called a boil or furuncle) around your tooth. It is also often caused by dental caries (tooth decay). This causes the pain you are having. °DIAGNOSIS  °Your caregiver can diagnose this problem by exam. °TREATMENT  °· If caused by an infection, it may be treated with medications which kill germs (antibiotics) and pain medications as prescribed by your caregiver. Take medications as directed. °· Only take over-the-counter or prescription medicines for pain, discomfort, or fever as directed by your caregiver. °· Whether the tooth ache today is caused by infection or dental disease, you should see your dentist as soon as possible for further care. °SEEK MEDICAL CARE IF: °The exam and treatment you received today has been provided on an emergency basis only. This is not a substitute for complete medical or dental care. If your problem worsens or new problems (symptoms) appear, and you are unable to meet with your dentist, call or return to this location. °SEEK IMMEDIATE MEDICAL CARE IF:  °· You have a fever. °· You develop redness and swelling of your face, jaw, or neck. °· You are unable to open your mouth. °· You have severe pain uncontrolled by pain medicine. °MAKE SURE YOU:  °· Understand these instructions. °· Will watch your condition. °· Will get help right away if you are not doing well or get worse. °Document Released: 03/25/2005 Document Revised: 06/17/2011 Document Reviewed: 11/11/2007 °ExitCare® Patient Information ©2015 [employer]. This information is not intended to replace advice given to you by your health care provider. Make sure you discuss any questions you have with your health care provider. ° °Emergency Department Resource Guide °1) Find a Doctor and Pay Out of Pocket °Although you won't have to find out who  is covered by your insurance plan, it is a good idea to ask around and get recommendations. You will then need to call the office and see if the doctor you have chosen will accept you as a new patient and what types of options they offer for patients who are self-pay. Some doctors offer discounts or will set up payment plans for their patients who do not have insurance, but you will need to ask so you aren't surprised when you get to your appointment. ° °2) Contact Your Local Health Department °Not all health departments have doctors that can see patients for sick visits, but many do, so it is worth a call to see if yours does. If you don't know where your local health department is, you can check in your phone book. The CDC also has a tool to help you locate your state's health department, and many state websites also have listings of all of their local health departments. ° °3) Find a Walk-in Clinic °If your illness is not likely to be very severe or complicated, you may want to try a walk in clinic. These are popping up all over the country in pharmacies, drugstores, and shopping centers. They're usually staffed by nurse practitioners or physician assistants that have been trained to treat common illnesses and complaints. They're usually fairly quick and inexpensive. However, if you have serious medical issues or chronic medical problems, these are probably not your best option. ° °No Primary Care Doctor: °- Call [facility] at  832-8000 - they can help you locate a primary   care doctor that  accepts your insurance, provides certain services, etc. °- Physician Referral Service- 1-800-533-3463 ° °Chronic Pain Problems: °Organization         Address  Phone   Notes  °[facility]  (336) 297-2271 Patients need to be referred by their primary care doctor.  ° °Medication Assistance: °Organization         Address  Phone   Notes  °[place] [facility] [address]  [address] °[place], [place] [address] (336) 641-8030 --Must be a resident of [place] °-- Must have NO insurance coverage whatsoever (no Medicaid/ Medicare, etc.) °-- The pt. MUST have a primary care doctor that directs their care regularly and follows them in the community °  °[facility]  (866) 331-1348   °[employer]  (888) 892-1162   ° °Agencies that provide inexpensive medical care: °Organization         Address  Phone   Notes  °[facility]  (336) 832-8035   °[facility]    (336) 832-7272   °[facility] [address] °[place], [place] [address] (336) 832-4777   °[facility] [address], °[place] (336) 271-4999   °[facility]    (336) 373-0678   °[facility]    (336) 272-1050   °[facility] ° [address], [place] Phone:  (336) 832-4444, Fax:  (336) 832-4440 Hours of Operation:  9 am - 6 pm, M-F.  Also accepts Medicaid/Medicare and self-pay.  °[facility] ° [address], [place] Phone: (336) 832-3150, Fax: (336) 832-3151. Hours of Operation:  8:30 am - 5:30 pm, M-F.  Also accepts Medicaid and self-pay.  °[facility] [place] [address], [place] Phone: (336) 878-6027   °[facility] [address], [place], [place] (336)723-1848, Ext. 123 Mondays & Thursdays: 7-9 AM.  First 15 patients are seen on a first come, first serve basis. °  ° °Medicaid-accepting [place] Providers: ° °Organization         Address  Phone   Notes  °[facility] [address], [place] (336) 641-2100 Also accepts self-pay patients.  °[facility] [address], [place] ° (336) 856-9996   °[facility] [address], [place] (336) 288-8857   °[facility] [address] [place] [address], [place] (336) 299-7000   °Veita Bland [address], [place]  ° (336) 373-1557 Only accepts  Access Medicaid patients after they have their name applied to their card.  ° °Self-Pay (no insurance) in [place]: ° °Organization         Address  Phone   Notes  °Sickle Cell Patients, [facility] [address], [place] (336) 832-1970   °[facility] [address], [place] (336) 832-4400   °[facility] [place] ° [address],  (336) 992-4800   °[facility]/Dr. Osei-Bonsu ° [address] [place] [address], [place] or [address], [place] (336) 841-8500 Phone number for both [place] and [place] locations is the same.  °[facility] [address], [place] (336) 299-0000   °[facility] [address] [place] [address], [place] or [address] (336) 852-7530 °(336) 878-2260   °  [facility] [address], [place] (336) 350-1642, phone; (336) 294-5005, fax Sees patients 1st and 3rd Saturday of every month.  Must not qualify for public or private insurance (i.e. Medicaid, Medicare, [place] Health Choice, Veterans' Benefits) • Household income should be no more than 200% of the poverty level •The clinic cannot treat you if you are pregnant or think you are pregnant • Sexually transmitted diseases are not treated at the clinic.  ° ° °Dental Care: °Organization         Address  Phone  Notes  °[facility] [address], [place] (336) 641-6152 Accepts children up to age 21 who are enrolled in Medicaid or [place] Health Choice; pregnant women with a Medicaid card; and children who have applied for Medicaid or [place] Health Choice, but were declined, whose parents can pay a reduced fee at time of service.  °[facility] [place]  [address], [place] (336) 641-7733 Accepts children up to age 21 who are  enrolled in Medicaid or [place] Health Choice; pregnant women with a Medicaid card; and children who have applied for Medicaid or [place] Health Choice, but were declined, whose parents can pay a reduced fee at time of service.  °[facility] ° [address], [place] (336) 641-4533 Patients are seen by appointment only. Walk-ins are not accepted. [facility] will see patients 18 years of age and older. °Monday - Tuesday (8am-5pm) °Most Wednesdays (8:30-5pm) °$30 per visit, cash only  °[facility] ° [address], [place] (336) 641-4533 Patients are seen by appointment only. Walk-ins are not accepted. [facility] will see patients 18 years of age and older. °One Wednesday Evening (Monthly: Volunteer Based).  $30 per visit, cash only  °[facility]  (919) 537-3737 for adults; Children under age 4, call Graduate Pediatric Dentistry at (919) [phone]. Children aged 4-14, please call (919) 537-3737 to request a pediatric application. ° Dental services are provided in all areas of dental care including fillings, crowns and bridges, complete and partial dentures, implants, gum treatment, root canals, and extractions. Preventive care is also provided. Treatment is provided to both adults and children. °Patients are selected via a lottery and there is often a waiting list. °  °[facility] [address], ° ° (336) 763-8833 [email or web address] °  °[facility] [address], [place], [place] (336)723-1848, Ext. 123 Second and Fourth Thursday of each month, opens at 6:30 AM; Clinic ends at 9 AM.  Patients are seen on a first-come first-served basis, and a limited number are seen during each clinic.  ° °[facility] ° [address], [place], [place] (336) 723-7904   Eligibility Requirements °You must have lived in [place], [place], or [place] counties for at least the last three months. °  You  cannot be eligible for state or federal sponsored healthcare insurance, including Veterans Administration, Medicaid, or Medicare. °  You generally cannot be eligible for healthcare insurance through your employer.  °  How to apply: °Eligibility screenings are held every Tuesday and Wednesday afternoon from 1:00 pm until 4:00 pm. You do not need an appointment for the interview!  °[facility] [address], [place], [place] 336-631-2330   °[facility] [facility]  336-342-8273   °[facility]  336-703-3100   °[facility]  336-570-6415   ° °Behavioral Health Resources in the Community: °Intensive Outpatient Programs °Organization         Address  Phone  Notes  °[facility] [address], [place], [place] 336-878-6098   °[facility] [address], [place], [place] 336-832-9800   °[facility] [address], [place], [place] ° 336-882-2125   °[facility] [address],  °[place], [place] 1-800-853-5163 or 336-641-4981   °Substance Abuse Resources °Organization         Address  Phone  Notes  °[facility]  336-882-2125   °[facility]  336-784-9470   °[facility]  336-285-9073   °[facility]  336-845-3988   °Residential & Outpatient Substance Abuse Program  1-800-659-3381   °Psychological Services °Organization         Address  Phone  Notes  °[facility]  336- 832-9600   °[facility]  336- 378-7881   °[facility] [address], [place] 1-800-853-5163 or 336-641-4981   ° °Mobile Crisis Teams °Organization         Address  Phone  Notes  °[facility], Mobile Crisis Care Unit  1-877-626-1772   °Assertive °[facility] ° [address] [place], [place] 336-834-9664   °Sharon DeEsch [address] °[place] 336-554-5454   ° °Self-Help/Support  Groups °Organization         Address  Phone             Notes  °[facility] - variety of support groups  336- 373-1402 Call for more information  °Narcotics Anonymous (NA), [facility] [address], °[place]  2 meetings at this location  ° °Residential Treatment Programs °Organization         Address  Phone  Notes  °[facility] [address],    °[place]  1-866-801-8205   °[facility] ° [address], [place], [place] 704-293-8524   °[facility] [address], [place] 336-845-3988 Admissions: 8am-3pm M-F  °[facility] [address]    °[place], [place] 336-841-1104   °[facility] [address], [place], [place] 336-379-7146   °[facility] [address]  °[place], [place] 336-285-9073   °[facility] Intensive Outpatient [address], [place], [place] 336-852-3033   °[facility] [address]  °[place], [place] 1-877-615-2722 or 336-784-9470   °[facility] [address] [place], [place] 336-227-7417 Accepts Medicaid  °[facility] [address]  °[place] 1-800-659-3381 Substance Abuse/Addiction Treatment  ° °[place] Behavioral Health Resources °Organization         Address  Phone  Notes  °[facility]  (888) 581-9988   °Julie Brannon, PhD [address] [place], [place]   (336) 349-5553 or (336) 951-0000   °[facility]   [address] °[place], [place] (336) 349-4454   °[facility] [address], [place], [place] (336) 342-8316 Insurance/Medicaid/sponsorship through [facility]  °[facility] [address]                                    [place], [place] (336) 342-8316 Therapy/tele-psych/case  °[facility]   [address]  ° [place], [place] (336) 349-2233    °Dr. Arfeen  (336) 349-4544   °[facility] [place]  [place]  [employer] [facility] [place] [facility] 1) [address], [place] °2) [address], [place] °3)  [address], [place] (336) 349-3220 °(336) 342-7768 ° °(336) 342-8140   °[place] [facility] (336) 342-1394 or (336) 342-3537 (After Hours)    ° ° ° °

## 2014-04-23 NOTE — ED Notes (Signed)
Pt. Refused to change. States "Do I need to for my wisdom teeth? I'll just wait".

## 2014-04-23 NOTE — ED Provider Notes (Signed)
CSN: [id number]     Arrival date & time 04/23/14  1313 History   First MD Initiated Contact with Patient 04/23/14 1653     Chief Complaint  Patient presents with  . Dental Pain  . Headache     (Consider location/radiation/quality/duration/timing/severity/associated sxs/prior Treatment) HPI  39 year old female presents with dental pain at all 4 wisdom teeth. This is been going on for the past 1-2 months of severe pain, has been bothering her for even longer than that. The patient denies any fevers. She's been taking ibuprofen and Tylenol without relief. She recently finished a course of antibiotics after being seen in the [facility] ER 5 days ago. She states she has an appointment at [facility] to have her teeth removed on February 1. The patient is unable to afford any other dentists due to lack of insurance and lack of funds. She is not able to afford her chronic fibromyalgia treatments and hypertension medicines. The patient is requesting 10/325 Percocets as the 5/325 do not help. No trouble speaking, drooling, or swelling. Patient states the pain is radiating up her jaw to her head causing a headache.  Past Medical History  Diagnosis Date  . Fibromyalgia   . Thyroid disease     hypothyroidism  . Fibromyalgia   . Depression   . PTSD (post-traumatic stress disorder)   . Bipolar 1 disorder   . Migraine    Past Surgical History  Procedure Laterality Date  . Tonsillectomy    . Brain surgery     History reviewed. No pertinent family history. History  Substance Use Topics  . Smoking status: Current Every Day Smoker    Types: Cigarettes  . Smokeless tobacco: Not on file  . Alcohol Use: No   OB History    No data available     Review of Systems  Constitutional: Negative for fever.  HENT: Positive for dental problem. Negative for trouble swallowing and voice change.   Gastrointestinal: Negative for vomiting.  Neurological: Positive for headaches. Negative for weakness  and numbness.  All other systems reviewed and are negative.     Allergies  Demerol; Ketorolac tromethamine; Penicillins; Prednisone; Tramadol; and Hydrocodone  Home Medications   Prior to Admission medications   Medication Sig Start Date End Date Taking? Authorizing Provider  acetaminophen (TYLENOL) 500 MG tablet Take 1,000 mg by mouth every 6 (six) hours as needed for moderate pain.    Historical Provider, MD  ALPRAZolam Prudy Feeler) 1 MG tablet Take 1 mg by mouth 4 (four) times daily.    Historical Provider, MD  Aspirin-Salicylamide-Caffeine (BC HEADACHE POWDER PO) Take 1 packet by mouth as needed (pain).    Historical Provider, MD  clindamycin (CLEOCIN) 150 MG capsule Take 1 capsule (150 mg total) by mouth 4 (four) times daily. For 7 days 04/18/14   Tammy L. Triplett, PA-C  FLUoxetine (PROZAC) 40 MG capsule Take 40 mg by mouth daily.    Historical Provider, MD  gabapentin (NEURONTIN) 300 MG capsule Take 1 capsule (300 mg total) by mouth 2 (two) times daily. Patient not taking: Reported on 04/18/2014 10/26/13   Teressa Lower, NP  hydrochlorothiazide (HYDRODIURIL) 25 MG tablet Take 1 tablet (25 mg total) by mouth daily. 04/10/14   Burgess Amor, PA-C  ibuprofen (ADVIL,MOTRIN) 200 MG tablet Take 400 mg by mouth every 6 (six) hours as needed for moderate pain.    Historical Provider, MD  Multiple Vitamin (MULTIVITAMIN WITH MINERALS) TABS tablet Take 1 tablet by mouth daily.  Historical Provider, MD  oxyCODONE-acetaminophen (PERCOCET) 10-325 MG per tablet Take 1 tablet by mouth every 8 (eight) hours as needed (severe pain). 04/23/14   Audree CamelScott T Jaiden Dinkins, MD   BP 164/118 mmHg  Pulse 101  Temp(Src) 98.1 F (36.7 C) (Oral)  Resp 18  Ht 5\' 6"  (1.676 m)  Wt 225 lb (102.059 kg)  BMI 36.33 kg/m2  SpO2 98%  LMP 04/14/2014 Physical Exam  Constitutional: She is oriented to person, place, and time. She appears well-developed and well-nourished.  HENT:  Head: Normocephalic and atraumatic.  Right Ear:  External ear normal.  Left Ear: External ear normal.  Nose: Nose normal.  Mouth/Throat: Uvula is midline and oropharynx is clear and moist. No trismus in the jaw. Abnormal dentition. Dental caries present. No dental abscesses.    Eyes: EOM are normal. Pupils are equal, round, and reactive to light. Right eye exhibits no discharge. Left eye exhibits no discharge.  Cardiovascular: Normal rate, regular rhythm and normal heart sounds.   Pulmonary/Chest: Effort normal and breath sounds normal.  Abdominal: She exhibits no distension.  Neurological: She is alert and oriented to person, place, and time.  CN 2-12 grossly intact. 5/5 strength in all 4 extremities. Grossly normal sensation  Skin: Skin is warm and dry.  Nursing note and vitals reviewed.   ED Course  Procedures (including critical care time) Labs Review Labs Reviewed - No data to display  Imaging Review No results found.   EKG Interpretation None      MDM   Final diagnoses:  Chronic dental pain    Patient with continued dental pain, has not been able to get her teeth removed at this time. Recently finished a course of antibiotics, do not feel she needs repeat antibiotics as I see no signs of dental abscess or new infection. She has no swelling at the floor of her mouth. The patient has normal airway and is speaking in complete sentences. At this point will give a course of pain medicines, strongly encouraged her to get a PCP and have given her other dental options. Will d/c with resource guide.    Audree CamelScott T Samaria Anes, MD 04/23/14 [phone]

## 2014-08-02 ENCOUNTER — Emergency Department (HOSPITAL_COMMUNITY)
Admission: EM | Admit: 2014-08-02 | Discharge: 2014-08-02 | Discharge status: Against Medical Advice | Payer: Self-pay | Attending: Emergency Medicine | Admitting: Emergency Medicine

## 2014-08-02 ENCOUNTER — Encounter (HOSPITAL_COMMUNITY): Payer: Self-pay | Admitting: Cardiology

## 2014-08-02 DIAGNOSIS — Z720 Tobacco use: Secondary | ICD-10-CM | POA: Insufficient documentation

## 2014-08-02 DIAGNOSIS — M797 Fibromyalgia: Secondary | ICD-10-CM | POA: Insufficient documentation

## 2014-08-02 NOTE — ED Notes (Signed)
No answer when called to patient room 

## 2014-08-02 NOTE — ED Notes (Addendum)
Pain from fibromyalgia and went back to work today.  Pt states she can't do it ,  Crying  uncontrollably in triage.

## 2014-08-03 ENCOUNTER — Encounter (HOSPITAL_COMMUNITY): Payer: Self-pay | Admitting: Emergency Medicine

## 2014-08-03 ENCOUNTER — Emergency Department (HOSPITAL_COMMUNITY)
Admission: EM | Admit: 2014-08-03 | Discharge: 2014-08-03 | Disposition: A | Discharge status: Home / Self Care | Payer: Self-pay | Attending: Emergency Medicine | Admitting: Emergency Medicine

## 2014-08-03 DIAGNOSIS — M791 Myalgia, unspecified site: Secondary | ICD-10-CM

## 2014-08-03 DIAGNOSIS — Z79899 Other long term (current) drug therapy: Secondary | ICD-10-CM | POA: Insufficient documentation

## 2014-08-03 DIAGNOSIS — G43909 Migraine, unspecified, not intractable, without status migrainosus: Secondary | ICD-10-CM | POA: Insufficient documentation

## 2014-08-03 DIAGNOSIS — F4310 Post-traumatic stress disorder, unspecified: Secondary | ICD-10-CM | POA: Insufficient documentation

## 2014-08-03 DIAGNOSIS — F419 Anxiety disorder, unspecified: Secondary | ICD-10-CM | POA: Insufficient documentation

## 2014-08-03 DIAGNOSIS — Z8639 Personal history of other endocrine, nutritional and metabolic disease: Secondary | ICD-10-CM | POA: Insufficient documentation

## 2014-08-03 DIAGNOSIS — R5383 Other fatigue: Secondary | ICD-10-CM | POA: Insufficient documentation

## 2014-08-03 DIAGNOSIS — G8929 Other chronic pain: Secondary | ICD-10-CM | POA: Insufficient documentation

## 2014-08-03 DIAGNOSIS — F319 Bipolar disorder, unspecified: Secondary | ICD-10-CM | POA: Insufficient documentation

## 2014-08-03 DIAGNOSIS — Z720 Tobacco use: Secondary | ICD-10-CM | POA: Insufficient documentation

## 2014-08-03 DIAGNOSIS — R079 Chest pain, unspecified: Secondary | ICD-10-CM | POA: Insufficient documentation

## 2014-08-03 DIAGNOSIS — Z880 Allergy status to penicillin: Secondary | ICD-10-CM | POA: Insufficient documentation

## 2014-08-03 NOTE — ED Notes (Signed)
MD Wickline at bedside. 

## 2014-08-03 NOTE — ED Provider Notes (Signed)
CSN: [id number]     Arrival date & time 08/03/14  1151 History   First MD Initiated Contact with Patient 08/03/14 1341     Chief Complaint  Patient presents with  . Extremity Pain     Patient is a 39 y.o. female presenting with extremity pain. The history is provided by the patient and a relative.  Extremity Pain This is a chronic problem. The current episode started more than 1 week ago. The problem occurs daily. The problem has been gradually worsening. Associated symptoms include chest pain and headaches. Pertinent negatives include no shortness of breath. Nothing aggravates the symptoms. Nothing relieves the symptoms.  Pt reports h/o fibromyalgia for years.  She reports she has chronic daily pain throughout her body.  She reports recent increased in work and had worsened pain in her extremities/body No falls/injury No fever She reports mild CP due to anxiety She reports mild HA She reports increased vaginal bleeding and fatigue  She tells me she usually requires percocet 10mg  for her pain control She does not have PCP  Past Medical History  Diagnosis Date  . Fibromyalgia   . Thyroid disease     hypothyroidism  . Fibromyalgia   . Depression   . PTSD (post-traumatic stress disorder)   . Bipolar 1 disorder   . Migraine    Past Surgical History  Procedure Laterality Date  . Tonsillectomy    . Brain surgery     History reviewed. No pertinent family history. History  Substance Use Topics  . Smoking status: Current Every Day Smoker    Types: Cigarettes  . Smokeless tobacco: Not on file  . Alcohol Use: No   OB History    No data available     Review of Systems  Constitutional: Positive for fatigue. Negative for fever.  Respiratory: Negative for shortness of breath.   Cardiovascular: Positive for chest pain.  Gastrointestinal: Negative for vomiting.  Musculoskeletal: Positive for myalgias.  Neurological: Positive for headaches.       Migraine headache    Psychiatric/Behavioral: The patient is nervous/anxious.   All other systems reviewed and are negative.     Allergies  Demerol; Ketorolac tromethamine; Penicillins; Prednisone; Tramadol; and Hydrocodone  Home Medications   Prior to Admission medications   Medication Sig Start Date End Date Taking? Authorizing Provider  ALPRAZolam Prudy Feeler(XANAX) 1 MG tablet Take 1 mg by mouth 4 (four) times daily.   Yes Historical Provider, MD  Aspirin-Salicylamide-Caffeine (BC HEADACHE POWDER PO) Take 1 packet by mouth as needed (pain).   Yes Historical Provider, MD  FLUoxetine (PROZAC) 40 MG capsule Take 40 mg by mouth daily.   Yes Historical Provider, MD  hydrochlorothiazide (HYDRODIURIL) 25 MG tablet Take 1 tablet (25 mg total) by mouth daily. 04/10/14  Yes Raynelle FanningJulie Idol, PA-C  ibuprofen (ADVIL,MOTRIN) 200 MG tablet Take 400 mg by mouth every 6 (six) hours as needed for moderate pain.   Yes Historical Provider, MD  Multiple Vitamin (MULTIVITAMIN WITH MINERALS) TABS tablet Take 1 tablet by mouth daily.   Yes Historical Provider, MD  oxyCODONE-acetaminophen (PERCOCET) 10-325 MG per tablet Take 1 tablet by mouth every 8 (eight) hours as needed (severe pain). 04/23/14  Yes Pricilla LovelessScott Goldston, MD   BP 141/76 mmHg  Pulse 104  Temp(Src) 98.2 F (36.8 C) (Oral)  Resp 18  Ht 5\' 5"  (1.651 m)  Wt 230 lb (104.327 kg)  BMI 38.27 kg/m2  SpO2 100%  LMP 07/28/2014 Physical Exam CONSTITUTIONAL: Well developed/well nourished HEAD: Normocephalic/atraumatic EYES:  EOMI/PERRL ENMT: Mucous membranes moist NECK: supple no meningeal signs SPINE/BACK:entire spine nontender CV: S1/S2 noted, no murmurs/rubs/gallops noted LUNGS: Lungs are clear to auscultation bilaterally, no apparent distress ABDOMEN: soft, nontender, no rebound or guarding, bowel sounds noted throughout abdomen GU:no cva tenderness NEURO: Pt is awake/alert/appropriate, moves all extremitiesx4.  No facial droop.  No arm drift.  She is ambulatory without  ataxia EXTREMITIES: pulses normal/equal, full ROM, no erythema or edema to extremities SKIN: warm, color normal PSYCH: anxious  ED Course  Procedures  Pt here with chronic daily fibromyalgia pain for years She did report CP, and also vag bleeding/fatigue - I advised labs/EKG I advised we could treat pain with APAP/ibuprofen and I advised I could not write Rx for pain meds as this is chronic pain which is best managed as outpatient She became upset. She became verbally abusive and began to curse at me.  She threatened physical harm I offered to evaluate her condition but she did not want any further testing Pt discharged  MDM   Final diagnoses:  Chronic pain  Myalgia    Nursing notes including past medical history and social history reviewed and considered in documentation     Zadie Rhine, MD 08/03/14 1502

## 2014-08-03 NOTE — ED Notes (Signed)
Pt yelling at EDP at nurses's station. Pt escorted from department by security.

## 2014-08-03 NOTE — ED Notes (Addendum)
Pt reports generalized pain, "fibromyalgia flare-up", migraine headache since yesterday. Pt reports sound sensitivity. nad noted.

## 2014-08-03 NOTE — Discharge Instructions (Signed)
Emergency care providers appreciate that many patients coming to us are in severe pain and we wish to address their pain in the safest, most responsible manner.  It is important to recognize however, that the proper treatment of chronic pain differs from that of the pain of injuries and acute illnesses.  Our goal is to provide quality, safe, personalized care and we thank you for giving us the opportunity to serve you. The use of narcotics and related agents for chronic pain syndromes may lead to additional physical and psychological problems.  Nearly as many people die from prescription narcotics each year as die from car crashes.  Additionally, this risk is increased if such prescriptions are obtained from a variety of sources.  Therefore, only your primary care physician or a pain management specialist is able to safely treat such syndromes with narcotic medications Gierke-term.    Documentation revealing such prescriptions have been sought from multiple sources may prohibit us from providing a refill or different narcotic medication.  Your name may be checked first through the [facility] Controlled Substances Reporting System.  This database is a record of controlled substance medication prescriptions that the patient has received.  This has been established by [facility] in an effort to eliminate the dangerous, and often life threatening, practice of obtaining multiple prescriptions from different medical providers.   If you have a chronic pain syndrome (i.e. chronic headaches, recurrent back or neck pain, dental pain, abdominal or pelvis pain without a specific diagnosis, or neuropathic pain such as fibromyalgia) or recurrent visits for the same condition without an acute diagnosis, you may be treated with non-narcotics and other non-addictive medicines.  Allergic reactions or negative side effects that may be reported by a patient to such medications will not typically lead to the use of a narcotic  analgesic or other controlled substance as an alternative.   Patients managing chronic pain with a personal physician should have provisions in place for breakthrough pain.  If you are in crisis, you should call your physician.  If your physician directs you to the emergency department, please have the doctor call and speak to our attending physician concerning your care.   When patients come to the Emergency Department (ED) with acute medical conditions in which the Emergency Department physician feels appropriate to prescribe narcotic or sedating pain medication, the physician will prescribe these in very limited quantities.  The amount of these medications will last only until you can see your primary care physician in his/her office.  Any patient who returns to the ED seeking refills should expect only non-narcotic pain medications.     Prescriptions for narcotic or sedating medications that have been lost, stolen or expired will not be refilled in the Emergency Department.        pa

## 2014-08-03 NOTE — ED Notes (Signed)
Pt upset that she has not been seen by EDP. RN told pt EDP signs up for patient based on acuity level and time in department and she will be signed up for as soon as possible. Pt states she has been told 4 different stories by 4 different people and requests to speak to charge nurse. Charge Nurse Garrison ColumbusJoanna Keith at bedside.

## 2015-10-05 ENCOUNTER — Encounter (HOSPITAL_COMMUNITY): Payer: Self-pay | Admitting: Emergency Medicine

## 2015-10-05 ENCOUNTER — Emergency Department (HOSPITAL_COMMUNITY)
Admission: EM | Admit: 2015-10-05 | Discharge: 2015-10-06 | Disposition: A | Discharge status: Home / Self Care | Payer: Self-pay | Attending: Emergency Medicine | Admitting: Emergency Medicine

## 2015-10-05 DIAGNOSIS — K029 Dental caries, unspecified: Secondary | ICD-10-CM | POA: Insufficient documentation

## 2015-10-05 DIAGNOSIS — F17210 Nicotine dependence, cigarettes, uncomplicated: Secondary | ICD-10-CM | POA: Insufficient documentation

## 2015-10-05 DIAGNOSIS — F1290 Cannabis use, unspecified, uncomplicated: Secondary | ICD-10-CM | POA: Insufficient documentation

## 2015-10-05 DIAGNOSIS — R45851 Suicidal ideations: Secondary | ICD-10-CM | POA: Insufficient documentation

## 2015-10-05 DIAGNOSIS — E039 Hypothyroidism, unspecified: Secondary | ICD-10-CM | POA: Insufficient documentation

## 2015-10-05 DIAGNOSIS — I10 Essential (primary) hypertension: Secondary | ICD-10-CM | POA: Insufficient documentation

## 2015-10-05 DIAGNOSIS — Z5181 Encounter for therapeutic drug level monitoring: Secondary | ICD-10-CM | POA: Insufficient documentation

## 2015-10-05 DIAGNOSIS — F319 Bipolar disorder, unspecified: Secondary | ICD-10-CM | POA: Insufficient documentation

## 2015-10-05 LAB — CBC
HCT: 42.3 % (ref 36.0–46.0)
HEMOGLOBIN: 14 g/dL (ref 12.0–15.0)
MCH: 27.9 pg (ref 26.0–34.0)
MCHC: 33.1 g/dL (ref 30.0–36.0)
MCV: 84.4 fL (ref 78.0–100.0)
Platelets: 287 10*3/uL (ref 150–400)
RBC: 5.01 MIL/uL (ref 3.87–5.11)
RDW: 15.7 % — AB (ref 11.5–15.5)
WBC: 10 10*3/uL (ref 4.0–10.5)

## 2015-10-05 LAB — COMPREHENSIVE METABOLIC PANEL
ALBUMIN: 4.7 g/dL (ref 3.5–5.0)
ALT: 16 U/L (ref 14–54)
AST: 17 U/L (ref 15–41)
Alkaline Phosphatase: 65 U/L (ref 38–126)
Anion gap: 8 (ref 5–15)
BUN: 8 mg/dL (ref 6–20)
CO2: 27 mmol/L (ref 22–32)
Calcium: 9.1 mg/dL (ref 8.9–10.3)
Chloride: 102 mmol/L (ref 101–111)
Creatinine, Ser: 0.7 mg/dL (ref 0.44–1.00)
GFR calc Af Amer: >60 mL/min (ref >60)
GFR calc non Af Amer: >60 mL/min (ref >60)
Glucose, Bld: 84 mg/dL (ref 65–99)
POTASSIUM: 3.8 mmol/L (ref 3.5–5.1)
Sodium: 137 mmol/L (ref 135–145)
TOTAL PROTEIN: 8.2 g/dL — AB (ref 6.5–8.1)
Total Bilirubin: 0.6 mg/dL (ref 0.3–1.2)

## 2015-10-05 LAB — RAPID URINE DRUG SCREEN, HOSP PERFORMED
Amphetamines: NOT DETECTED
BARBITURATES: NOT DETECTED
BENZODIAZEPINES: POSITIVE — AB
COCAINE: NOT DETECTED
OPIATES: NOT DETECTED
Tetrahydrocannabinol: POSITIVE — AB

## 2015-10-05 LAB — ACETAMINOPHEN LEVEL

## 2015-10-05 LAB — ETHANOL: Alcohol, Ethyl (B): <5 mg/dL (ref <5)

## 2015-10-05 LAB — SALICYLATE LEVEL: SALICYLATE LVL: 4.5 mg/dL (ref 2.8–30.0)

## 2015-10-05 MED ORDER — ACETAMINOPHEN 500 MG PO TABS
1000.0000 mg | ORAL_TABLET | Freq: Once | ORAL | Status: AC
  Administered 2015-10-05: 1000 mg via ORAL
  Filled 2015-10-05: qty 2

## 2015-10-05 MED ORDER — CLINDAMYCIN HCL 150 MG PO CAPS: 300.0000 mg | ORAL_CAPSULE | Freq: Three times a day (TID) | ORAL | Status: DC

## 2015-10-05 MED ORDER — HYDROCHLOROTHIAZIDE 12.5 MG PO CAPS
12.5000 mg | ORAL_CAPSULE | Freq: Once | ORAL | Status: AC
  Administered 2015-10-05: 12.5 mg via ORAL
  Filled 2015-10-05: qty 1

## 2015-10-05 MED ORDER — HYDROCHLOROTHIAZIDE 25 MG PO TABS: 25.0000 mg | ORAL_TABLET | Freq: Every day | ORAL | Status: DC

## 2015-10-05 NOTE — ED Provider Notes (Signed)
CSN: [id number]     Arrival date & time 10/05/15  1925 History   First MD Initiated Contact with Patient 10/05/15 2008     Chief Complaint  Patient presents with  . Medical Clearance     (Consider location/radiation/quality/duration/timing/severity/associated sxs/prior Treatment) HPI Comments: [facility] with SI and depression. She was found to meet inpatient criteria and sent here for medical clearance. She reports she has been off blood pressure medications for several years and blood pressure is noted to be elevated today. No chest pain, SOB, visual problems. She also ran out of her Valium and Paxil 3 days ago. She was taking 20mg  Paxil at night and 10mg  Valium 4 times daily. No nausea or vomiting. No agitation. She does report dental pain thought associated with her wisdom teeth. No fever or facial swelling.   The history is provided by the patient. No language interpreter was used.    Past Medical History  Diagnosis Date  . Fibromyalgia   . Thyroid disease     hypothyroidism  . Fibromyalgia   . Depression   . PTSD (post-traumatic stress disorder)   . Bipolar 1 disorder (HCC)   . Migraine    Past Surgical History  Procedure Laterality Date  . Tonsillectomy    . Brain surgery     No family history on file. Social History  Substance Use Topics  . Smoking status: Current Every Day Smoker    Types: Cigarettes  . Smokeless tobacco: None  . Alcohol Use: No   OB History    No data available     Review of Systems  Constitutional: Negative for fever and chills.  HENT: Positive for dental problem. Negative for facial swelling.   Eyes: Negative for visual disturbance.  Respiratory: Negative.  Negative for shortness of breath.   Cardiovascular: Negative.  Negative for chest pain.  Gastrointestinal: Negative.  Negative for nausea, vomiting and abdominal pain.  Musculoskeletal: Negative.   Skin: Negative.    Neurological: Negative.  Negative for light-headedness and headaches.      Allergies  Demerol; Ketorolac tromethamine; Penicillins; Prednisone; Tramadol; and Hydrocodone  Home Medications   Prior to Admission medications   Medication Sig Start Date End Date Taking? Authorizing Provider  diazepam (VALIUM) 10 MG tablet Take 10 mg by mouth every 6 (six) hours as needed for anxiety.   Yes Historical Provider, MD  ibuprofen (ADVIL,MOTRIN) 200 MG tablet Take 400 mg by mouth every 6 (six) hours as needed for moderate pain.   Yes Historical Provider, MD  PARoxetine (PAXIL) 20 MG tablet Take 20 mg by mouth daily.   Yes Historical Provider, MD  hydrochlorothiazide (HYDRODIURIL) 25 MG tablet Take 1 tablet (25 mg total) by mouth daily. Patient not taking: Reported on 10/05/2015 04/10/14   Burgess AmorJulie Idol, PA-C  oxyCODONE-acetaminophen (PERCOCET) 10-325 MG per tablet Take 1 tablet by mouth every 8 (eight) hours as needed (severe pain). Patient not taking: Reported on 10/05/2015 04/23/14   Pricilla LovelessScott Goldston, MD   BP 183/100 mmHg  Pulse 77  Temp(Src) 98.2 F (36.8 C) (Oral)  Resp 16  SpO2 100%  LMP 10/01/2015 Physical Exam  Constitutional: She is oriented to person, place, and time. She appears well-developed and well-nourished.  HENT:  Head: Normocephalic.  Severe decay to #'s 31 and 18. No gingival or facial swelling. Tender dentition.   Neck: Normal range of motion. Neck supple.  Cardiovascular: Normal rate and regular rhythm.   Pulmonary/Chest:  Effort normal and breath sounds normal. She has no wheezes. She has no rales.  Abdominal: Soft. Bowel sounds are normal. There is no tenderness. There is no rebound and no guarding.  Musculoskeletal: Normal range of motion.  Neurological: She is alert and oriented to person, place, and time.  Skin: Skin is warm and dry. No rash noted.  Psychiatric: She has a normal mood and affect.    ED Course  Procedures (including critical care time) Labs Review Labs  Reviewed  COMPREHENSIVE METABOLIC PANEL  ETHANOL  SALICYLATE LEVEL  ACETAMINOPHEN LEVEL  CBC  URINE RAPID DRUG SCREEN, HOSP PERFORMED    Imaging Review No results found. I have personally reviewed and evaluated these images and lab results as part of my medical decision-making.   EKG Interpretation None      MDM   Final diagnoses:  None    1. SI 2. Depression 3. Dental pain 4. Hypertension  [facility]. She [facility] and [facility].   She has mildly elevated blood pressure but no symptoms of chest pain, SOB, lightheadedness, or visual changes. Will check chemistries and EKG.   She has complaint of dental pain with significant caries found on exam. No visualized abscess. Will cover with clindamycin (PCN allergy).  Hypertension - will start on HCTZ to address blood pressure. Kidney function normal, EKG normal.   Patient has been medically cleared for psychiatric admission as planned [facility].  Elpidio AnisShari Chayanne Speir, PA-C 10/05/15 2246  Rolland PorterMark James, MD 10/19/15 [phone]

## 2015-10-05 NOTE — ED Notes (Signed)
Called BH to give report on pt. Floor refusing to take pt because BP is slightly higher than when she was seen earlier. AC for BH called, will call back.
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
## 2015-10-05 NOTE — Discharge Instructions (Signed)
TRANSFER TO BEHAVIORAL HEALTH FOR ADMISSION FOR TREATMENT OF DEPRESSION AND SUICIDAL IDEATION.  Dental Caries Dental caries (also called tooth decay) is the most common oral disease. It can occur at any age but is more common in children and young adults.  HOW DENTAL CARIES DEVELOPS  The process of decay begins when bacteria and foods (particularly sugars and starches) combine in your mouth to produce plaque. Plaque is a substance that sticks to the hard, outer surface of a tooth (enamel). The bacteria in plaque produce acids that attack enamel. These acids may also attack the root surface of a tooth (cementum) if it is exposed. Repeated attacks dissolve these surfaces and create holes in the tooth (cavities). If left untreated, the acids destroy the other layers of the tooth.  RISK FACTORS  Frequent sipping of sugary beverages.   Frequent snacking on sugary and starchy foods, especially those that easily get stuck in the teeth.   Poor oral hygiene.   Dry mouth.   Substance abuse such as methamphetamine abuse.   Broken or poor-fitting dental restorations.   Eating disorders.   Gastroesophageal reflux disease (GERD).   Certain radiation treatments to the head and neck. SYMPTOMS In the early stages of dental caries, symptoms are seldom present. Sometimes white, chalky areas may be seen on the enamel or other tooth layers. In later stages, symptoms may include:  Pits and holes on the enamel.  Toothache after sweet, hot, or cold foods or drinks are consumed.  Pain around the tooth.  Swelling around the tooth. DIAGNOSIS  Most of the time, dental caries is detected during a regular dental checkup. A diagnosis is made after a thorough medical and dental history is taken and the surfaces of your teeth are checked for signs of dental caries. Sometimes special instruments, such as lasers, are used to check for dental caries. Dental X-ray exams may be taken so that areas not visible  to the eye (such as between the contact areas of the teeth) can be checked for cavities.  TREATMENT  If dental caries is in its early stages, it may be reversed with a fluoride treatment or an application of a remineralizing agent at the dental office. Thorough brushing and flossing at home is needed to aid these treatments. If it is in its later stages, treatment depends on the location and extent of tooth destruction:   If a small area of the tooth has been destroyed, the destroyed area will be removed and cavities will be filled with a material such as gold, silver amalgam, or composite resin.   If a large area of the tooth has been destroyed, the destroyed area will be removed and a cap (crown) will be fitted over the remaining tooth structure.   If the center part of the tooth (pulp) is affected, a procedure called a root canal will be needed before a filling or crown can be placed.   If most of the tooth has been destroyed, the tooth may need to be pulled (extracted). HOME CARE INSTRUCTIONS You can prevent, stop, or reverse dental caries at home by practicing good oral hygiene. Good oral hygiene includes:  Thoroughly cleaning your teeth at least twice a day with a toothbrush and dental floss.   Using a fluoride toothpaste. A fluoride mouth rinse may also be used if recommended by your dentist or health care provider.   Restricting the amount of sugary and starchy foods and sugary liquids you consume.   Avoiding frequent snacking on  these foods and sipping of these liquids.   Keeping regular visits with a dentist for checkups and cleanings. PREVENTION   Practice good oral hygiene.  Consider a dental sealant. A dental sealant is a coating material that is applied by your dentist to the pits and grooves of teeth. The sealant prevents food from being trapped in them. It may protect the teeth for several years.  Ask about fluoride supplements if you live in a community without  fluorinated water or with water that has a low fluoride content. Use fluoride supplements as directed by your dentist or health care provider.  Allow fluoride varnish applications to teeth if directed by your dentist or health care provider.   This information is not intended to replace advice given to you by your health care provider. Make sure you discuss any questions you have with your health care provider.   Document Released: 12/15/2001 Document Revised: 04/15/2014 Document Reviewed: 03/27/2012 Elsevier Interactive Patient Education 2016 [employer]. Hypertension Hypertension, commonly called high blood pressure, is when the force of blood pumping through your arteries is too strong. Your arteries are the blood vessels that carry blood from your heart throughout your body. A blood pressure reading consists of a higher number over a lower number, such as 110/72. The higher number (systolic) is the pressure inside your arteries when your heart pumps. The lower number (diastolic) is the pressure inside your arteries when your heart relaxes. Ideally you want your blood pressure below 120/80. Hypertension forces your heart to work harder to pump blood. Your arteries may become narrow or stiff. Having untreated or uncontrolled hypertension can cause heart attack, stroke, kidney disease, and other problems. RISK FACTORS Some risk factors for high blood pressure are controllable. Others are not.  Risk factors you cannot control include:   Race. You may be at higher risk if you are African American.  Age. Risk increases with age.  Gender. Men are at higher risk than women before age 42 years. After age 73, women are at higher risk than men. Risk factors you can control include:  Not getting enough exercise or physical activity.  Being overweight.  Getting too much fat, sugar, calories, or salt in your diet.  Drinking too much alcohol. SIGNS AND SYMPTOMS Hypertension does not usually  cause signs or symptoms. Extremely high blood pressure (hypertensive crisis) may cause headache, anxiety, shortness of breath, and nosebleed. DIAGNOSIS To check if you have hypertension, your health care provider will measure your blood pressure while you are seated, with your arm held at the level of your heart. It should be measured at least twice using the same arm. Certain conditions can cause a difference in blood pressure between your right and left arms. A blood pressure reading that is higher than normal on one occasion does not mean that you need treatment. If it is not clear whether you have high blood pressure, you may be asked to return on a different day to have your blood pressure checked again. Or, you may be asked to monitor your blood pressure at home for 1 or more weeks. TREATMENT Treating high blood pressure includes making lifestyle changes and possibly taking medicine. Living a healthy lifestyle can help lower high blood pressure. You may need to change some of your habits. Lifestyle changes may include:  Following the DASH diet. This diet is high in fruits, vegetables, and whole grains. It is low in salt, red meat, and added sugars.  Keep your sodium intake below  2,300 mg per day.  Getting at least 30-45 minutes of aerobic exercise at least 4 times per week.  Losing weight if necessary.  Not smoking.  Limiting alcoholic beverages.  Learning ways to reduce stress. Your health care provider may prescribe medicine if lifestyle changes are not enough to get your blood pressure under control, and if one of the following is true:  You are 2518-39 years of age and your systolic blood pressure is above 140.  You are 39 years of age or older, and your systolic blood pressure is above 150.  Your diastolic blood pressure is above 90.  You have diabetes, and your systolic blood pressure is over 140 or your diastolic blood pressure is over 90.  You have kidney disease and your  blood pressure is above 140/90.  You have heart disease and your blood pressure is above 140/90. Your personal target blood pressure may vary depending on your medical conditions, your age, and other factors. HOME CARE INSTRUCTIONS  Have your blood pressure rechecked as directed by your health care provider.   Take medicines only as directed by your health care provider. Follow the directions carefully. Blood pressure medicines must be taken as prescribed. The medicine does not work as well when you skip doses. Skipping doses also puts you at risk for problems.  Do not smoke.   Monitor your blood pressure at home as directed by your health care provider. SEEK MEDICAL CARE IF:   You think you are having a reaction to medicines taken.  You have recurrent headaches or feel dizzy.  You have swelling in your ankles.  You have trouble with your vision. SEEK IMMEDIATE MEDICAL CARE IF:  You develop a severe headache or confusion.  You have unusual weakness, numbness, or feel faint.  You have severe chest or abdominal pain.  You vomit repeatedly.  You have trouble breathing. MAKE SURE YOU:   Understand these instructions.  Will watch your condition.  Will get help right away if you are not doing well or get worse.   This information is not intended to replace advice given to you by your health care provider. Make sure you discuss any questions you have with your health care provider.   Document Released: 03/25/2005 Document Revised: 08/09/2014 Document Reviewed: 01/15/2013 Elsevier Interactive Patient Education [employer]2016 [employer]vier Inc.

## 2015-10-06 ENCOUNTER — Inpatient Hospital Stay (HOSPITAL_COMMUNITY)
Admission: AD | Admit: 2015-10-06 | Discharge: 2015-10-09 | DRG: 885 | Disposition: A | Discharge status: Home / Self Care | Payer: No Typology Code available for payment source | Attending: Psychiatry | Admitting: Psychiatry

## 2015-10-06 ENCOUNTER — Encounter (HOSPITAL_COMMUNITY): Payer: Self-pay

## 2015-10-06 DIAGNOSIS — F17210 Nicotine dependence, cigarettes, uncomplicated: Secondary | ICD-10-CM | POA: Diagnosis present

## 2015-10-06 DIAGNOSIS — F332 Major depressive disorder, recurrent severe without psychotic features: Principal | ICD-10-CM | POA: Diagnosis present

## 2015-10-06 DIAGNOSIS — F1310 Sedative, hypnotic or anxiolytic abuse, uncomplicated: Secondary | ICD-10-CM | POA: Diagnosis present

## 2015-10-06 DIAGNOSIS — F1220 Cannabis dependence, uncomplicated: Secondary | ICD-10-CM | POA: Diagnosis present

## 2015-10-06 DIAGNOSIS — F1120 Opioid dependence, uncomplicated: Secondary | ICD-10-CM | POA: Diagnosis present

## 2015-10-06 DIAGNOSIS — F329 Major depressive disorder, single episode, unspecified: Secondary | ICD-10-CM | POA: Diagnosis present

## 2015-10-06 DIAGNOSIS — R45851 Suicidal ideations: Secondary | ICD-10-CM | POA: Diagnosis present

## 2015-10-06 MED ORDER — DICYCLOMINE HCL 20 MG PO TABS
20.0000 mg | ORAL_TABLET | Freq: Four times a day (QID) | ORAL | Status: DC | PRN
  Administered 2015-10-07: 20 mg via ORAL

## 2015-10-06 MED ORDER — CLONIDINE HCL 0.1 MG PO TABS
0.1000 mg | ORAL_TABLET | Freq: Once | ORAL | Status: AC
  Administered 2015-10-06: 0.1 mg via ORAL
  Filled 2015-10-06: qty 1

## 2015-10-06 MED ORDER — HYDROXYZINE HCL 25 MG PO TABS: 25.0000 mg | ORAL_TABLET | Freq: Four times a day (QID) | ORAL | Status: DC | PRN

## 2015-10-06 MED ORDER — METHOCARBAMOL 500 MG PO TABS
500.0000 mg | ORAL_TABLET | Freq: Three times a day (TID) | ORAL | Status: DC | PRN
  Filled 2015-10-06: qty 1

## 2015-10-06 MED ORDER — CHLORDIAZEPOXIDE HCL 25 MG PO CAPS
25.0000 mg | ORAL_CAPSULE | Freq: Three times a day (TID) | ORAL | Status: AC
  Administered 2015-10-07 – 2015-10-08 (×3): 25 mg via ORAL
  Filled 2015-10-06 (×3): qty 1

## 2015-10-06 MED ORDER — CLONIDINE HCL 0.1 MG PO TABS
0.1000 mg | ORAL_TABLET | ORAL | Status: DC
  Administered 2015-10-09: 0.1 mg via ORAL
  Filled 2015-10-06 (×4): qty 1

## 2015-10-06 MED ORDER — CLONIDINE HCL 0.1 MG PO TABS
0.1000 mg | ORAL_TABLET | Freq: Four times a day (QID) | ORAL | Status: AC
  Administered 2015-10-06 – 2015-10-07 (×7): 0.1 mg via ORAL
  Filled 2015-10-06 (×8): qty 1

## 2015-10-06 MED ORDER — ONDANSETRON 4 MG PO TBDP: 4.0000 mg | ORAL_TABLET | Freq: Four times a day (QID) | ORAL | Status: DC | PRN

## 2015-10-06 MED ORDER — CHLORDIAZEPOXIDE HCL 25 MG PO CAPS
25.0000 mg | ORAL_CAPSULE | ORAL | Status: AC
  Administered 2015-10-08 – 2015-10-09 (×2): 25 mg via ORAL
  Filled 2015-10-06 (×2): qty 1

## 2015-10-06 MED ORDER — CHLORDIAZEPOXIDE HCL 25 MG PO CAPS
25.0000 mg | ORAL_CAPSULE | Freq: Four times a day (QID) | ORAL | Status: AC
  Administered 2015-10-06 – 2015-10-07 (×4): 25 mg via ORAL
  Filled 2015-10-06 (×4): qty 1

## 2015-10-06 MED ORDER — THIAMINE HCL 100 MG/ML IJ SOLN
100.0000 mg | Freq: Once | INTRAMUSCULAR | Status: AC
  Administered 2015-10-06: 100 mg via INTRAMUSCULAR
  Filled 2015-10-06: qty 2

## 2015-10-06 MED ORDER — ACETAMINOPHEN 325 MG PO TABS
650.0000 mg | ORAL_TABLET | Freq: Four times a day (QID) | ORAL | Status: DC | PRN
  Administered 2015-10-07: 650 mg via ORAL
  Filled 2015-10-06: qty 2

## 2015-10-06 MED ORDER — CLONIDINE HCL 0.1 MG PO TABS
0.1000 mg | ORAL_TABLET | Freq: Every day | ORAL | Status: DC
  Filled 2015-10-06: qty 1

## 2015-10-06 MED ORDER — HYDROCHLOROTHIAZIDE 25 MG PO TABS
25.0000 mg | ORAL_TABLET | Freq: Every day | ORAL | Status: DC
  Administered 2015-10-06 – 2015-10-09 (×4): 25 mg via ORAL
  Filled 2015-10-06 (×5): qty 1
  Filled 2015-10-06: qty 7

## 2015-10-06 MED ORDER — HYDROXYZINE HCL 25 MG PO TABS
25.0000 mg | ORAL_TABLET | Freq: Four times a day (QID) | ORAL | Status: DC | PRN
  Administered 2015-10-06 (×2): 25 mg via ORAL
  Filled 2015-10-06 (×2): qty 1

## 2015-10-06 MED ORDER — CHLORDIAZEPOXIDE HCL 25 MG PO CAPS: 25.0000 mg | ORAL_CAPSULE | Freq: Four times a day (QID) | ORAL | Status: AC | PRN

## 2015-10-06 MED ORDER — CLINDAMYCIN HCL 300 MG PO CAPS
300.0000 mg | ORAL_CAPSULE | Freq: Three times a day (TID) | ORAL | Status: DC
  Administered 2015-10-06 – 2015-10-09 (×12): 300 mg via ORAL
  Filled 2015-10-06: qty 1
  Filled 2015-10-06: qty 12
  Filled 2015-10-06 (×7): qty 1
  Filled 2015-10-06 (×2): qty 12
  Filled 2015-10-06 (×5): qty 1

## 2015-10-06 MED ORDER — ADULT MULTIVITAMIN W/MINERALS CH
1.0000 | ORAL_TABLET | Freq: Every day | ORAL | Status: DC
Start: 2015-10-06 — End: 2015-10-09
  Administered 2015-10-06 – 2015-10-09 (×4): 1 via ORAL
  Filled 2015-10-06 (×6): qty 1

## 2015-10-06 MED ORDER — PAROXETINE HCL 20 MG PO TABS
20.0000 mg | ORAL_TABLET | Freq: Every day | ORAL | Status: DC
  Administered 2015-10-06 – 2015-10-09 (×4): 20 mg via ORAL
  Filled 2015-10-06 (×4): qty 1
  Filled 2015-10-06: qty 7
  Filled 2015-10-06: qty 1

## 2015-10-06 MED ORDER — CHLORDIAZEPOXIDE HCL 25 MG PO CAPS: 25.0000 mg | ORAL_CAPSULE | Freq: Every day | ORAL | Status: DC

## 2015-10-06 MED ORDER — VITAMIN B-1 100 MG PO TABS
100.0000 mg | ORAL_TABLET | Freq: Every day | ORAL | Status: DC
  Administered 2015-10-07 – 2015-10-09 (×3): 100 mg via ORAL
  Filled 2015-10-06 (×5): qty 1

## 2015-10-06 NOTE — ED Notes (Signed)
[facility] called for transport to [facility].  
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
## 2015-10-06 NOTE — [facility] Group Notes (Signed)
[facility]   10/06/2015 10:01 AM  Participation Quality: Invited. DID NOT ATTEND. Pt sleeping in room.   Smart, Dianelly Ferran LCSW

## 2015-10-06 NOTE — Progress Notes (Signed)
Recreation Therapy Notes  Date: 06.30.2017 Time: 9:30am Location: [address] Group Room   Group Topic: Stress Management  Goal Area(s) Addresses:  Patient will actively participate in stress management techniques presented during session.   Behavioral Response: Did not attend.   Danuta Huseman L Shatarra Wehling, LRT/CTRS        Jamol Ginyard L 10/06/2015 3:30 PM 
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
## 2015-10-06 NOTE — ED Notes (Signed)
[facility] advised that NP for the floor states that BP needs to be down prior to her coming back.

## 2015-10-06 NOTE — [facility] Suicide Risk Assessment (Deleted)
[facility]   Principal Problem: MDD (major depressive disorder), recurrent severe, without psychosis (HCC) Discharge Diagnoses:  Patient Active Problem List   Diagnosis Date Noted  . MDD (major depressive disorder), recurrent severe, without psychosis (HCC) [F33.2] 10/06/2015  . Benzodiazepine abuse [F13.10] 10/06/2015  . Opioid use disorder, moderate, dependence (HCC) [F11.20] 10/06/2015  . Cannabis use disorder, moderate, dependence (HCC) [F12.20] 10/06/2015    Total Time spent with patient: 30 minutes  Musculoskeletal: Strength & Muscle Tone: within normal limits Gait & Station: normal Patient leans: N/A  Psychiatric Specialty Exam: Review of Systems  Psychiatric/Behavioral: Positive for depression and substance abuse. Negative for suicidal ideas. The patient is nervous/anxious.   All other systems reviewed and are negative.   Blood pressure 134/104, pulse 103, temperature 98.1 F (36.7 C), temperature source Oral, resp. rate 16, height 5\' 2"  (1.575 m), weight 92.987 kg (205 lb), last menstrual period 10/01/2015.Body mass index is 37.49 kg/(m^2).  General Appearance: Casual  Eye Contact::  Fair  Speech:  Clear and Coherent409  Volume:  Normal  Mood:  Anxious and Depressed  Affect:  Depressed  Thought Process:  Goal Directed and Descriptions of Associations: Intact  Orientation:  Full (Time, Place, and Person)  Thought Content:  Logical  Suicidal Thoughts:  Yes.  with intent/plan thinking of multiple plans to do it   Homicidal Thoughts:  No  Memory:  Immediate;   Fair Recent;   Fair Remote;   Fair  Judgement:  Impaired  Insight:  Lacking  Psychomotor Activity:  Restlessness  Concentration:  Fair  Recall:  FiservFair  Fund of Knowledge:Fair  Language: Fair  Akathisia:  No  Handed:  Right  AIMS (if indicated):     Assets:  Desire for Improvement  Sleep:  Number of Hours: 1.75  Cognition: WNL  ADL's:  Intact   Mental Status Per Nursing  Assessment::   On Admission:     Demographic Factors:  Caucasian  Loss Factors: NA  Historical Factors: Impulsivity  Risk Reduction Factors:   Positive social support  Continued Clinical Symptoms:  Alcohol/Substance Abuse/Dependencies Previous Psychiatric Diagnoses and Treatments  Cognitive Features That Contribute To Risk:  Polarized thinking    Suicide Risk:  Moderate:  Frequent suicidal ideation with limited intensity, and duration, some specificity in terms of plans, no associated intent, good self-control, limited dysphoria/symptomatology, some risk factors present, and identifiable protective factors, including available and accessible social support.    Plan Of Care/Follow-up recommendations:  Activity:  no restrictions Diet:  regular Tests:  as needed Other:  follow up with aftercare  Jaqualyn Juday, MD 10/06/2015, 11:09 AM

## 2015-10-06 NOTE — [facility] Group Notes (Signed)
[facility] LCSW Group Therapy  10/06/2015 1:19 PM  Type of Therapy:  Group Therapy  Participation Level:  Active  Participation Quality:  Attentive  Affect:  Depressed and Tearful  Cognitive:  Oriented  Insight:  Improving  Engagement in Therapy:  Improving  Modes of Intervention:  Confrontation, Discussion, Education, Exploration, Problem-solving, Rapport Building, Socialization and Support  Summary of Progress/Problems: Feelings around Relapse. Group members discussed the meaning of relapse and shared personal stories of relapse, how it affected them and others, and how they perceived themselves during this time. Group members were encouraged to identify triggers, warning signs and coping skills used when facing the possibility of relapse. Social supports were discussed and explored in detail. Post Acute Withdrawal Syndrome (handout provided) was introduced and examined. Pt's were encouraged to ask questions, talk about key points associated with PAWS, and process this information in terms of relapse prevention. Wyatt Mageabitha was attentive and engaged during today's processing group. She shared that "since my kids were taken by my exhusband and I haven't seen them in five years." Pt tearful and crying at times during group. Pt reports no motivation to get up in the morning but "I do have a desire to get a job and get my life on track."   Smart, Damarian Priola LCSW 10/06/2015, 1:19 PM

## 2015-10-06 NOTE — Tx Team (Signed)
Initial Interdisciplinary Treatment Plan   PATIENT STRESSORS: Financial difficulties Marital or family conflict Medication change or noncompliance   PATIENT STRENGTHS: Ability for insight Average or above average intelligence Capable of independent living Communication skills General fund of knowledge Motivation for treatment/growth Supportive family/friends Work skills   PROBLEM LIST: Problem List/Patient Goals Date to be addressed Date deferred Reason deferred Estimated date of resolution  Risk for suicide      depression      "anger"      "help with coping skills, learning to get back out there with life and work"                                     DISCHARGE CRITERIA:  Improved stabilization in mood, thinking, and/or behavior Verbal commitment to aftercare and medication compliance  PRELIMINARY DISCHARGE PLAN: Attend aftercare/continuing care group Outpatient therapy  PATIENT/FAMIILY INVOLVEMENT: This treatment plan has been presented to and reviewed with the patient, [place].  The patient and family have been given the opportunity to ask questions and make suggestions.  Jacques Navyhillips, [name] A 10/06/2015, 4:09 AM

## 2015-10-07 MED ORDER — POLYETHYLENE GLYCOL 3350 17 G PO PACK
17.0000 g | PACK | Freq: Every day | ORAL | Status: DC
  Filled 2015-10-07: qty 1

## 2015-10-07 MED ORDER — GABAPENTIN 300 MG PO CAPS
300.0000 mg | ORAL_CAPSULE | Freq: Three times a day (TID) | ORAL | Status: DC
  Administered 2015-10-07 – 2015-10-09 (×9): 300 mg via ORAL
  Filled 2015-10-07: qty 28
  Filled 2015-10-07: qty 1
  Filled 2015-10-07: qty 28
  Filled 2015-10-07 (×3): qty 1
  Filled 2015-10-07 (×2): qty 28
  Filled 2015-10-07 (×8): qty 1

## 2015-10-07 MED ORDER — MAGNESIUM HYDROXIDE 400 MG/5ML PO SUSP: 30.0000 mL | Freq: Once | ORAL | Status: DC

## 2015-10-07 MED ORDER — POLYETHYLENE GLYCOL 3350 17 G PO PACK: 17.0000 g | PACK | Freq: Every day | ORAL | Status: DC

## 2015-10-07 MED ORDER — QUETIAPINE FUMARATE 400 MG PO TABS
400.0000 mg | ORAL_TABLET | Freq: Every day | ORAL | Status: DC
  Administered 2015-10-07 – 2015-10-08 (×2): 400 mg via ORAL
  Filled 2015-10-07 (×3): qty 1
  Filled 2015-10-07: qty 7

## 2015-10-07 NOTE — [facility] Group Notes (Signed)
  [facility] LCSW Group Therapy Note  10/07/2015  10:15 AM  Type of Therapy and Topic:  Group Therapy: Avoiding Self-Sabotaging and Enabling Behaviors  Participation Level:  Active  Participation Quality:  Monopolizing and Sharing  Affect:  Anxious and Tearful  Cognitive:  Alert and Oriented  Insight:  Developing/Improving  Engagement in Therapy:  Developing/Improving   Therapeutic models used: Cognitive Behavioral Therapy,  Person-Centered Therapy and Motivational Interviewing  Modes of Intervention:  Discussion, Exploration, Orientation, Rapport Building, Socialization and Support  Summary of Progress/Problems:  The main focus of today's process group was for the patient to identify ways in which they have in the past sabotaged their own recovery. Motivational Interviewing was utilized to identify motivation they  may have for wanting to change. Topic of self care was introduced using the self care model of HALT (Am I hungry, angry, lonely, tired?).  Patient needed to share main points of her story and was able to do so. Patient was able to process her tendency to blame others rather than take responsibility for her situation. Patient is open to followup with a therapist for accountability.    Carney Bernatherine C Harrill, LCSW

## 2015-10-07 NOTE — Progress Notes (Signed)
D). Patient denies SI/HI/AVH. Calm and cooperative.  Patient interacting well with staff and other patients. Attending groups.On self inventory patient reports, 0/10 for both depression and hopelessness and 3/10 for anxiety. Her goal is: "Continue praying and join my groups." Patient has been struggling with pain in her gums and teeth this shift. She thinks the antibiotic she is taking is not working. A). Emotional support and encouragement offered. Encouraged patient to speak to the doctor about her mouth/gums. Education provided on medication, indications and side effect. Q 15 minute checks done for safety. R). Continue to maintain safety checks. Continue to take medications as prescribed. Continue to complete self inventory and attend groups.

## 2015-10-07 NOTE — Progress Notes (Signed)
Writer spoke with patient 1:1 and she repots feeling much better since coming here. She was compliant with her medications and reports that she feels that she and her fiance need to be apart for a while. She reports that she has been praying a lot and feels that this has helped with her detox. Support givne and safety maintained on unit with 15 min checks.

## 2015-10-07 NOTE — Progress Notes (Signed)
[facility] MD Progress Note  10/07/2015 9:18 AM Sheila Horn  MRN:  [id number] Subjective:  Patient reports " I am feeing better today."  Objective:Sheila Horn is awake, alert and oriented X4. Seen standing in the hall interacting with peers.  Denies suicidal or homicidal ideation. Denies auditory or visual hallucination and does not appear to be responding to internal stimuli. Patient reports she has been at [facility] for the past 2 days for control her blood pressure. Patient reports she is feeling better and feels ready to go.Patient interacts well with staff and others. Patient reports she is medication compliant without mediation side effects.States her depression 0/10. Patient states "I feeling not myself today" states that Reports good appetite and states she is resting well. Patient report she is hopeful to discharge on Monday to [facility]. Support, encouragement and reassurance was provided.   Principal Problem: MDD (major depressive disorder), recurrent severe, without psychosis ([place]) Diagnosis:   Patient Active Problem List   Diagnosis Date Noted  . MDD (major depressive disorder), recurrent severe, without psychosis ([place]) [F33.2] 10/06/2015  . Benzodiazepine abuse [F13.10] 10/06/2015  . Opioid use disorder, moderate, dependence ([place]) [F11.20] 10/06/2015  . Cannabis use disorder, moderate, dependence ([place]) [F12.20] 10/06/2015   Total Time spent with patient: 30 minutes  Past Psychiatric History: See Above  Past Medical History:  Past Medical History  Diagnosis Date  . Fibromyalgia   . Thyroid disease     hypothyroidism  . Fibromyalgia   . Depression   . PTSD (post-traumatic stress disorder)   . Bipolar 1 disorder ([place])   . Migraine     Past Surgical History  Procedure Laterality Date  . Tonsillectomy    . Brain surgery     Family History:  Family History  Problem Relation Age of Onset  . Suicidality Maternal Grandmother   . Suicidality Cousin    Family  Psychiatric  History: See Above Social History:  History  Alcohol Use No     History  Drug Use  . Yes  . Special: Oxycodone, Marijuana    Social History   Social History  . Marital Status: Divorced    Spouse Name: N/A  . Number of Children: N/A  . Years of Education: N/A   Social History Main Topics  . Smoking status: Current Every Day Smoker -- 0.25 packs/day for 2 years    Types: Cigarettes  . Smokeless tobacco: None  . Alcohol Use: No  . Drug Use: Yes    Special: Oxycodone, Marijuana  . Sexual Activity: Yes    Birth Control/ Protection: None   Other Topics Concern  . None   Social History Narrative   Additional Social History:    Pain Medications: Pt denies Prescriptions: Pt denies Over the Counter: Pt denies History of alcohol / drug use?: Yes Longest period of sobriety (when/how [place]): unknown Name of Substance 1: Opioids 1 - Age of First Use: unknown 1 - Amount (size/oz): unknown 1 - Frequency: unknown 1 - Duration: ongoing 1 - Last Use / Amount: unknown                  Sleep: Fair  Appetite:  Fair  Current Medications: Current Facility-Administered Medications  Medication Dose Route Frequency Provider Last Rate Last Dose  . acetaminophen (TYLENOL) tablet 650 mg  650 mg Oral Q6H PRN Lurena Nida, NP      . chlordiazePOXIDE (LIBRIUM) capsule 25 mg  25 mg Oral Q6H PRN Saramma Eappen,  MD      . chlordiazePOXIDE (LIBRIUM) capsule 25 mg  25 mg Oral TID Ursula Alert, MD       Followed by  . [START ON 10/08/2015] chlordiazePOXIDE (LIBRIUM) capsule 25 mg  25 mg Oral BH-qamhs Ursula Alert, MD       Followed by  . [START ON 10/10/2015] chlordiazePOXIDE (LIBRIUM) capsule 25 mg  25 mg Oral Daily Saramma Eappen, MD      . clindamycin (CLEOCIN) capsule 300 mg  300 mg Oral TID Lurena Nida, NP   300 mg at 10/07/15 0817  . cloNIDine (CATAPRES) tablet 0.1 mg  0.1 mg Oral QID Ursula Alert, MD   0.1 mg at 10/07/15 0817   Followed by  . [START ON  10/08/2015] cloNIDine (CATAPRES) tablet 0.1 mg  0.1 mg Oral BH-qamhs Ursula Alert, MD       Followed by  . [START ON 10/11/2015] cloNIDine (CATAPRES) tablet 0.1 mg  0.1 mg Oral QAC breakfast Saramma Eappen, MD      . dicyclomine (BENTYL) tablet 20 mg  20 mg Oral Q6H PRN Ursula Alert, MD   20 mg at 10/07/15 0819  . hydrochlorothiazide (HYDRODIURIL) tablet 25 mg  25 mg Oral Daily Lurena Nida, NP   25 mg at 10/07/15 0817  . hydrOXYzine (ATARAX/VISTARIL) tablet 25 mg  25 mg Oral Q6H PRN Ursula Alert, MD   25 mg at 10/06/15 2152  . methocarbamol (ROBAXIN) tablet 500 mg  500 mg Oral Q8H PRN Ursula Alert, MD      . multivitamin with minerals tablet 1 tablet  1 tablet Oral Daily Ursula Alert, MD   1 tablet at 10/07/15 0817  . ondansetron (ZOFRAN-ODT) disintegrating tablet 4 mg  4 mg Oral Q6H PRN Ursula Alert, MD      . PARoxetine (PAXIL) tablet 20 mg  20 mg Oral Daily Lurena Nida, NP   20 mg at 10/07/15 0817  . thiamine (VITAMIN B-1) tablet 100 mg  100 mg Oral Daily Ursula Alert, MD   100 mg at 10/07/15 2595    Lab Results:  Results for orders placed or performed during the hospital encounter of 10/05/15 (from the past 48 hour(s))  Comprehensive metabolic panel     Status: Abnormal   Collection Time: 10/05/15  9:05 PM  Result Value Ref Range   Sodium 137 135 - 145 mmol/L   Potassium 3.8 3.5 - 5.1 mmol/L   Chloride 102 101 - 111 mmol/L   CO2 27 22 - 32 mmol/L   Glucose, Bld 84 65 - 99 mg/dL   BUN 8 6 - 20 mg/dL   Creatinine, Ser 0.70 0.44 - 1.00 mg/dL   Calcium 9.1 8.9 - 10.3 mg/dL   Total Protein 8.2 (H) 6.5 - 8.1 g/dL   Albumin 4.7 3.5 - 5.0 g/dL   AST 17 15 - 41 U/L   ALT 16 14 - 54 U/L   Alkaline Phosphatase 65 38 - 126 U/L   Total Bilirubin 0.6 0.3 - 1.2 mg/dL   GFR calc non Af Amer >60 >60 mL/min   GFR calc Af Amer >60 >60 mL/min    Comment: (NOTE) The eGFR has been calculated using the CKD EPI equation. This calculation has not been validated in all clinical  situations. eGFR's persistently <60 mL/min signify possible Chronic Kidney Disease.    Anion gap 8 5 - 15  Ethanol     Status: None   Collection Time: 10/05/15  9:05 PM  Result Value  Ref Range   Alcohol, Ethyl (B) <5 <5 mg/dL    Comment:        LOWEST DETECTABLE LIMIT FOR SERUM ALCOHOL IS 5 mg/dL FOR MEDICAL PURPOSES ONLY   Salicylate level     Status: None   Collection Time: 10/05/15  9:05 PM  Result Value Ref Range   Salicylate Lvl 4.5 2.8 - 30.0 mg/dL  Acetaminophen level     Status: Abnormal   Collection Time: 10/05/15  9:05 PM  Result Value Ref Range   Acetaminophen (Tylenol), Serum <10 (L) 10 - 30 ug/mL    Comment:        THERAPEUTIC CONCENTRATIONS VARY SIGNIFICANTLY. A RANGE OF 10-30 ug/mL MAY BE AN EFFECTIVE CONCENTRATION FOR MANY PATIENTS. HOWEVER, SOME ARE BEST TREATED AT CONCENTRATIONS OUTSIDE THIS RANGE. ACETAMINOPHEN CONCENTRATIONS >150 ug/mL AT 4 HOURS AFTER INGESTION AND >50 ug/mL AT 12 HOURS AFTER INGESTION ARE OFTEN ASSOCIATED WITH TOXIC REACTIONS.   cbc     Status: Abnormal   Collection Time: 10/05/15  9:05 PM  Result Value Ref Range   WBC 10.0 4.0 - 10.5 K/uL   RBC 5.01 3.87 - 5.11 MIL/uL   Hemoglobin 14.0 12.0 - 15.0 g/dL   HCT 42.3 36.0 - 46.0 %   MCV 84.4 78.0 - 100.0 fL   MCH 27.9 26.0 - 34.0 pg   MCHC 33.1 30.0 - 36.0 g/dL   RDW 15.7 (H) 11.5 - 15.5 %   Platelets 287 150 - 400 K/uL  Rapid urine drug screen (hospital performed)     Status: Abnormal   Collection Time: 10/05/15 10:20 PM  Result Value Ref Range   Opiates NONE DETECTED NONE DETECTED   Cocaine NONE DETECTED NONE DETECTED   Benzodiazepines POSITIVE (A) NONE DETECTED   Amphetamines NONE DETECTED NONE DETECTED   Tetrahydrocannabinol POSITIVE (A) NONE DETECTED   Barbiturates NONE DETECTED NONE DETECTED    Comment:        DRUG SCREEN FOR MEDICAL PURPOSES ONLY.  IF CONFIRMATION IS NEEDED FOR ANY PURPOSE, NOTIFY LAB WITHIN 5 DAYS.        LOWEST DETECTABLE LIMITS FOR URINE  DRUG SCREEN Drug Class       Cutoff (ng/mL) Amphetamine      1000 Barbiturate      200 Benzodiazepine   989 Tricyclics       211 Opiates          300 Cocaine          300 THC              50     Blood Alcohol level:  Lab Results  Component Value Date   ETH <5 10/05/2015   ETH <11 94/17/4081    Metabolic Disorder Labs: No results found for: HGBA1C, MPG No results found for: PROLACTIN No results found for: CHOL, TRIG, HDL, CHOLHDL, VLDL, LDLCALC  Physical Findings: AIMS: Facial and Oral Movements Muscles of Facial Expression: None, normal Lips and Perioral Area: None, normal Jaw: None, normal Tongue: None, normal,Extremity Movements Upper (arms, wrists, hands, fingers): None, normal Lower (legs, knees, ankles, toes): None, normal, Trunk Movements Neck, shoulders, hips: None, normal, Overall Severity Severity of abnormal movements (highest score from questions above): None, normal Incapacitation due to abnormal movements: None, normal Patient's awareness of abnormal movements (rate only patient's report): No Awareness, Dental Status Current problems with teeth and/or dentures?: Yes (wisdom teeth) Does patient usually wear dentures?: No  CIWA:  CIWA-Ar Total: 0 COWS:  COWS Total Score: 1  Musculoskeletal: Strength & Muscle Tone: within normal limits Gait & Station: normal Patient leans: N/A  Psychiatric Specialty Exam: Physical Exam  Review of Systems  Psychiatric/Behavioral: Positive for substance abuse. Negative for depression, suicidal ideas and hallucinations. The patient is nervous/anxious.   All other systems reviewed and are negative.   Blood pressure 117/91, pulse 75, temperature 97.9 F (36.6 C), temperature source Oral, resp. rate 18, height 5' 2"  (1.575 m), weight 92.987 kg (205 lb), last menstrual period 10/01/2015, SpO2 99 %.Body mass index is 37.49 kg/(m^2).  General Appearance: Casual and Well Groomed  Eye Contact:  Good  Speech:  Clear and Coherent   Volume:  Normal  Mood:  Anxious and Euphoric  Affect:  Appropriate and Congruent  Thought Process:  Coherent  Orientation:  Full (Time, Place, and Person)  Thought Content:  Hallucinations: None  Suicidal Thoughts:  No  Homicidal Thoughts:  No  Memory:  Immediate;   Fair Recent;   Fair Remote;   Fair  Judgement:  Intact  Insight:  Fair  Psychomotor Activity:  Normal  Concentration:  Concentration: Fair  Recall:  [employer] of Knowledge:  Fair  Language:  Fair  Akathisia:  No  Handed:  Right  AIMS (if indicated):     Assets:  Communication Skills Desire for Improvement Resilience Transportation  ADL's:  Intact  Cognition:  WNL  Sleep:  Number of Hours: 6.75    I agree with current treatment plan on 10/07/2015, Patient seen face-to-face for psychiatric evaluation follow-up, chart reviewed. Reviewed the information documented and agree with the treatment plan.  Treatment Plan Summary: Daily contact with patient to assess and evaluate symptoms and progress in treatment and Medication management    1. Admit for crisis management and stabilization, estimated length of stay 3-5 days.  2. Medication management to reduce current symptoms to base line and improve the patient's overall level of functioning; Continue the Librium/Clonidine detox protocols for opioid/Benzodiazepine use, Paxil 20 mg for depression. 3. Treat health problems as indicated.  4. Develop treatment plan to decrease risk of relapse upon discharge and the need for readmission.  5. Psycho-social education regarding relapse prevention and self care.  6. Health care follow up as needed for medical problems.  7. Review, reconcile, and reinstate any pertinent home medications for other health issues where appropriate. 8. Call for consults with hospitalist for any additional specialty patient care services as needed.  Derrill Center, NP 10/07/2015, 9:18 AM

## 2015-10-08 MED ORDER — ALUM & MAG HYDROXIDE-SIMETH 200-200-20 MG/5ML PO SUSP
30.0000 mL | ORAL | Status: DC | PRN
  Administered 2015-10-08: 30 mL via ORAL

## 2015-10-08 NOTE — [facility] Group Notes (Signed)
[facility] Group Notes:  Healthy Support Systems  Date:  10/08/2015  Time:  6:55 PM  Type of Therapy:  Psychoeducational Skills  Participation Level:  Active  Participation Quality:  Intrusive, Inattentive, Monopolizing and Redirectable  Affect:  Blunted  Cognitive:  Appropriate  Insight:  Lacking  Engagement in Group:  Monopolizing and Off Topic  Modes of Intervention:  Discussion, Education and Limit-setting  Summary of Progress/Problems: Patient attended group but was inattentive and monopolizing throughout most of the group. She started discussing drugs with another peer and left group when writer started to speak about taking accountability of actions. She received daily workbook.  Sheila Horn, Sheila Horn E 10/08/2015, 6:55 PM

## 2015-10-08 NOTE — Progress Notes (Signed)
[facility]  10/08/2015 9:41 AM Sheila Horn  MRN:  [id number] Subjective:  Patient reports " I am feeing better today, I am enjoying the groups hearing everyone's story."  Objective:Sheila Horn is awake, alert and oriented X4. Seen attending group session interacting with peers.  Denies suicidal or homicidal ideation. Denies auditory or visual hallucination and does not appear to be responding to internal stimuli. Patient reports she is feeling better and feels ready to go. Patient reports interacting well with staff and others. discontinued clonidine due to hypotensive. Patient reports she is medication compliant without mediation side effects.States her depression 0/10. Reports good appetite and states she is resting well.Patient reports she is eager to discharged and start with a temp- agency that SW is bringing on Monday.  Patient [facility]. Support, encouragement and reassurance was provided.   Principal Problem: MDD (major depressive disorder), recurrent severe, without psychosis ([facility]) Diagnosis:   Patient Active Problem List   Diagnosis Date Noted  . MDD (major depressive disorder), recurrent severe, without psychosis ([facility]) [F33.2] 10/06/2015  . Benzodiazepine abuse [F13.10] 10/06/2015  . Opioid use disorder, moderate, dependence ([facility]) [F11.20] 10/06/2015  . Cannabis use disorder, moderate, dependence ([facility]) [F12.20] 10/06/2015   Total Time spent with patient: 30 minutes  Past Psychiatric History: See Above  Past Medical History:  Past Medical History  Diagnosis Date  . Fibromyalgia   . Thyroid disease     hypothyroidism  . Fibromyalgia   . Depression   . PTSD (post-traumatic stress disorder)   . Bipolar 1 disorder ([facility])   . Migraine     Past Surgical History  Procedure Laterality Date  . Tonsillectomy    . Brain surgery     Family History:  Family History  Problem Relation Age of Onset  . Suicidality  Maternal Grandmother   . Suicidality Cousin    Family Psychiatric  History: See Above Social History:  History  Alcohol Use No     History  Drug Use  . Yes  . Special: Oxycodone, Marijuana    Social History   Social History  . Marital Status: Divorced    Spouse Name: N/A  . Number of Children: N/A  . Years of Education: N/A   Social History Main Topics  . Smoking status: Current Every Day Smoker -- 0.25 packs/day for 2 years    Types: Cigarettes  . Smokeless tobacco: None  . Alcohol Use: No  . Drug Use: Yes    Special: Oxycodone, Marijuana  . Sexual Activity: Yes    Birth Control/ Protection: None   Other Topics Concern  . None   Social History Narrative   Additional Social History:    Pain Medications: Pt denies Prescriptions: Pt denies Over the Counter: Pt denies History of alcohol / drug use?: Yes Longest period of sobriety (when/how [place]): unknown Name of Substance 1: Opioids 1 - Age of First Use: unknown 1 - Amount (size/oz): unknown 1 - Frequency: unknown 1 - Duration: ongoing 1 - Last Use / Amount: unknown                  Sleep: Fair  Appetite:  Fair  Current Medications: Current Facility-Administered Medications  Medication Dose Route Frequency Provider Last Rate Last Dose  . acetaminophen (TYLENOL) tablet 650 mg  650 mg Oral Q6H PRN Kristeen MansFran E Hobson, NP   650 mg at 10/07/15 1635  . chlordiazePOXIDE (LIBRIUM) capsule 25 mg  25 mg Oral Q6H PRN Jomarie Longs, MD      . chlordiazePOXIDE (LIBRIUM) capsule 25 mg  25 mg Oral BH-qamhs Jomarie Longs, MD       Followed by  . [START ON 10/10/2015] chlordiazePOXIDE (LIBRIUM) capsule 25 mg  25 mg Oral Daily Saramma Eappen, MD      . clindamycin (CLEOCIN) capsule 300 mg  300 mg Oral TID Kristeen Mans, NP   300 mg at 10/08/15 0826  . cloNIDine (CATAPRES) tablet 0.1 mg  0.1 mg Oral QID Jomarie Longs, MD   0.1 mg at 10/07/15 2106   Followed by  . cloNIDine (CATAPRES) tablet 0.1 mg  0.1 mg Oral BH-qamhs  Jomarie Longs, MD       Followed by  . [START ON 10/11/2015] cloNIDine (CATAPRES) tablet 0.1 mg  0.1 mg Oral QAC breakfast Saramma Eappen, MD      . dicyclomine (BENTYL) tablet 20 mg  20 mg Oral Q6H PRN Jomarie Longs, MD   20 mg at 10/07/15 0819  . gabapentin (NEURONTIN) capsule 300 mg  300 mg Oral TID PC & HS Saramma Eappen, MD   300 mg at 10/08/15 0826  . hydrochlorothiazide (HYDRODIURIL) tablet 25 mg  25 mg Oral Daily Kristeen Mans, NP   25 mg at 10/08/15 0826  . hydrOXYzine (ATARAX/VISTARIL) tablet 25 mg  25 mg Oral Q6H PRN Jomarie Longs, MD   25 mg at 10/06/15 2152  . methocarbamol (ROBAXIN) tablet 500 mg  500 mg Oral Q8H PRN Jomarie Longs, MD      . multivitamin with minerals tablet 1 tablet  1 tablet Oral Daily Jomarie Longs, MD   1 tablet at 10/08/15 0826  . ondansetron (ZOFRAN-ODT) disintegrating tablet 4 mg  4 mg Oral Q6H PRN Jomarie Longs, MD      . PARoxetine (PAXIL) tablet 20 mg  20 mg Oral Daily Kristeen Mans, NP   20 mg at 10/08/15 0826  . QUEtiapine (SEROQUEL) tablet 400 mg  400 mg Oral QHS Jomarie Longs, MD   400 mg at 10/07/15 2106  . thiamine (VITAMIN B-1) tablet 100 mg  100 mg Oral Daily Jomarie Longs, MD   100 mg at 10/08/15 9528    Lab Results:  No results found for this or any previous visit (from the past 48 hour(s)).  Blood Alcohol level:  Lab Results  Component Value Date   ETH <5 10/05/2015   ETH <11 10/07/2012    Metabolic Disorder Labs: No results found for: HGBA1C, MPG No results found for: PROLACTIN No results found for: CHOL, TRIG, HDL, CHOLHDL, VLDL, LDLCALC  Physical Findings: AIMS: Facial and Oral Movements Muscles of Facial Expression: None, normal Lips and Perioral Area: None, normal Jaw: None, normal Tongue: None, normal,Extremity Movements Upper (arms, wrists, hands, fingers): None, normal Lower (legs, knees, ankles, toes): None, normal, Trunk Movements Neck, shoulders, hips: None, normal, Overall Severity Severity of abnormal  movements (highest score from questions above): None, normal Incapacitation due to abnormal movements: None, normal Patient's awareness of abnormal movements (rate only patient's report): No Awareness, Dental Status Current problems with teeth and/or dentures?: Yes Does patient usually wear dentures?: No  CIWA:  CIWA-Ar Total: 0 COWS:  COWS Total Score: 3  Musculoskeletal: Strength & Muscle Tone: within normal limits Gait & Station: normal Patient leans: N/A  Psychiatric Specialty Exam: Physical Exam  Review of Systems  Psychiatric/Behavioral: Positive for substance abuse. Negative for depression, suicidal ideas and hallucinations. The patient is nervous/anxious.   All  other systems reviewed and are negative.   Blood pressure 97/69, pulse 92, temperature 97.7 F (36.5 C), temperature source Oral, resp. rate 19, height 5\' 2"  (1.575 m), weight 92.987 kg (205 lb), last menstrual period 10/01/2015, SpO2 99 %.Body mass index is 37.49 kg/(m^2).  General Appearance: Casual and Well Groomed  Eye Contact:  Good  Speech:  Clear and Coherent  Volume:  Normal  Mood:  Anxious and Euphoric  Affect:  Appropriate and Congruent  Thought Process:  Coherent  Orientation:  Full (Time, Place, and Person)  Thought Content:  Hallucinations: None  Suicidal Thoughts:  No  Homicidal Thoughts:  No  Memory:  Immediate;   Fair Recent;   Fair Remote;   Fair  Judgement:  Intact  Insight:  Fair  Psychomotor Activity:  Normal  Concentration:  Concentration: Fair  Recall:  [employer]  Fund of Knowledge:  Fair  Language:  Fair  Akathisia:  No  Handed:  Right  AIMS (if indicated):     Assets:  Communication Skills Desire for Improvement Resilience Transportation  ADL's:  Intact  Cognition:  WNL  Sleep:  Number of Hours: 6.25    I agree with current treatment plan on 10/08/2015, Patient seen face-to-face for psychiatric evaluation follow-up, chart reviewed. Reviewed the information documented and agree  with the treatment plan.  Treatment Plan Summary: Daily contact with patient to assess and evaluate symptoms and progress in treatment and Medication management   1. Admit for crisis management and stabilization, estimated length of stay 3-5 days.  2. Medication management to reduce current symptoms to base line and improve the patient's overall level of functioning;  Continue the Librium/(Clonidine-discontinued for hypotension) detox protocols for opioid/Benzodiazepine use, Paxil 20 mg for depression. 3. Treat health problems as indicated.  4. Develop treatment plan to decrease risk of relapse upon discharge and the need for readmission.  5. Psycho-social education regarding relapse prevention and self care.  6. Health care follow up as needed for medical problems.  7. Review, reconcile, and reinstate any pertinent home medications for other health issues where appropriate. 8. Call for consults with hospitalist for any additional specialty patient care services as needed.  Oneta Rackanika N Ron Junco, NP 10/08/2015, 9:41 AM

## 2015-10-08 NOTE — Progress Notes (Signed)
Pt attended AA meeting that lasted from 2000 to approximately 2110.  

## 2015-10-08 NOTE — [facility] Group Notes (Signed)
  [facility] LCSW Group Therapy Note   10/08/2015 at 10:15 AM  Type of Therapy and Topic: Group Therapy: Feelings Around Returning Home & Establishing a Supportive Framework and Activity to Identify signs of Improvement or Decompensation   Participation Level: Active   Description of Group:  Patients first processed thoughts and feelings about up coming discharge. These included fears of upcoming changes, lack of change, new living environments, judgements and expectations from others and overall stigma of MH issues. We then discussed what is a supportive framework? What does it look like feel like and how do I discern it from and unhealthy non-supportive network? Learn how to cope when supports are not helpful and don't support you. Discuss what to do when your family/friends are not supportive.   Therapeutic Goals Addressed in Processing Group:  1. Patient will identify one healthy supportive network that they can use at discharge. 2. Patient will identify one factor of a supportive framework and how to tell it from an unhealthy network. 3. Patient able to identify one coping skill to use when they do not have positive supports from others. 4. Patient will demonstrate ability to communicate their needs through discussion and/or role plays.  Summary of Patient Progress:  Pt engages easily during group session yet has tendency to be intrusive and tangential with remarks. As patients processed their feelings/anxiety about discharge and described healthy supports patient continued to focus on past verses future. Patient chose a visual to represent decompensation as pushing people away and improvement as close relationships.   Carney Bernatherine C Nagee Goates, LCSW

## 2015-10-08 NOTE — Progress Notes (Signed)
Patient has been up active, playing card in the dayroom. She attended group this evening and was compliant with her medications. Patient currently denies having pain, -si/hi/a/v hall. Support and encouragement offered, safety maintained on unit, will continue to monitor.

## 2015-10-08 NOTE — Progress Notes (Signed)
Patient ID: Sheila Horn, female   DOB: 09/08/1975, 40 y.o.   MRN: [id number]  DAR: Pt. Denies SI/HI and A/V Hallucinations but reports she is able to contract for safety. She reports sleep is fair, appetite is fair, energy level is low, and concentration is poor. She rates depression 7/10, hopelessness 6/10, and anxiety 10/10. Patient does report acid reflux and received PRN Maalox. She also reports pain 3/10 in her mouth from her "wisdom teeth." Support and encouragement provided to the patient. Scheduled medications administered per physician's orders. Patient reported feeling dizzy and lightheaded this morning and therefore Clonidine was not given. Pt. Came to writer later and stated she was feeling better. She is seen in the milieu attending groups and interacting with her peers. Q15 minute checks are maintained for safety.

## 2015-10-09 MED ORDER — QUETIAPINE FUMARATE 400 MG PO TABS: 400.0000 mg | ORAL_TABLET | Freq: Every day | ORAL | Status: AC

## 2015-10-09 MED ORDER — CLINDAMYCIN HCL 300 MG PO CAPS: 300.0000 mg | ORAL_CAPSULE | Freq: Three times a day (TID) | ORAL | Status: AC

## 2015-10-09 MED ORDER — NICOTINE POLACRILEX 2 MG MT GUM
2.0000 mg | CHEWING_GUM | OROMUCOSAL | Status: DC | PRN
  Administered 2015-10-09: 2 mg via ORAL
  Filled 2015-10-09: qty 1

## 2015-10-09 MED ORDER — GABAPENTIN 300 MG PO CAPS: 300.0000 mg | ORAL_CAPSULE | Freq: Three times a day (TID) | ORAL | Status: AC

## 2015-10-09 MED ORDER — PAROXETINE HCL 20 MG PO TABS: 20.0000 mg | ORAL_TABLET | Freq: Every day | ORAL | Status: AC

## 2015-10-09 MED ORDER — HYDROCHLOROTHIAZIDE 25 MG PO TABS: 25.0000 mg | ORAL_TABLET | Freq: Every day | ORAL | Status: AC
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
## 2015-10-09 NOTE — Progress Notes (Signed)
Recreation Therapy Notes  Date: 07.03.2017 Time: 9:30am Location: 300 Hall Group Room   Group Topic: Stress Management  Goal Area(s) Addresses:  Patient will actively participate in stress management techniques presented during session.   Behavioral Response: Did not attend.   Inari Shin L Talaysha Freeberg, LRT/CTRS        Dahiana Kulak L 10/09/2015 11:46 AM 

## 2015-10-09 NOTE — [facility] Suicide Risk Assessment (Addendum)
[facility] INPATIENT:  Family/Significant Other Suicide Prevention Education  Suicide Prevention Education:  Contact Attempts: [name] (pt's fiance) [phone] has been identified by the patient as the family member/significant other with whom the patient will be residing, and identified as the person(s) who will aid the patient in the event of a mental health crisis.  With written consent from the patient, two attempts were made to provide suicide prevention education, prior to and/or following the patient's discharge.  We were unsuccessful in providing suicide prevention education.  A suicide education pamphlet was given to the patient to share with family/significant other.  Date and time of first attempt 10/06/15 at 4:15PM (unable to leave voicemail)  Date and time of second attempt: 10/09/15 at 9:30AM (Unable to leave voicemail)   Smart, Sheila Kryder LCSW 10/09/2015, 9:33 AM   CSW spoke with Sheila Horn who reported no concerns about pt d/cing today. SPE completed with pt; he verbalized understanding of above information.  Trula SladeHeather Smart, MSW, LCSW Clinical Social Worker 10/09/2015 1:45 PM
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
## 2015-10-09 NOTE — Progress Notes (Signed)
Patient has been up and active on the unit and attended group this evening. Patient currently denies having pain, -si/hi/a/v hallucinations. She refused her clonidine reporting that she felt that she did not need it and is hoping that it will help with her blood pressure. Her blood pressure has been low in the am. Support and encouragement offered, safety maintained on unit, will continue to monitor.

## 2015-10-09 NOTE — Progress Notes (Signed)
Discharge note: pt received both written and verbal discharge instructions. Pt verbalized understanding of discharge instructions. Pt agreed to f/u appt and med regimen. Pt received sample meds, prescriptions, SRA, AVS and transition record. Pt safely discharged to lobby and picked up by boyfriend.

## 2015-10-09 NOTE — [facility] Suicide Risk Assessment (Signed)
[facility]   Principal Problem: MDD (major depressive disorder), recurrent severe, without psychosis (HCC) Discharge Diagnoses:  Patient Active Problem List   Diagnosis Date Noted  . MDD (major depressive disorder), recurrent severe, without psychosis (HCC) [F33.2] 10/06/2015  . Benzodiazepine abuse [F13.10] 10/06/2015  . Opioid use disorder, moderate, dependence (HCC) [F11.20] 10/06/2015  . Cannabis use disorder, moderate, dependence (HCC) [F12.20] 10/06/2015    Total Time spent with patient: 30 minutes  Musculoskeletal: Strength & Muscle Tone: within normal limits Gait & Station: normal Patient leans: N/A  Psychiatric Specialty Exam: ROS no headache, no chest pain, no shortness of breath   Blood pressure 133/116, pulse 134, temperature 97.8 F (36.6 C), temperature source Oral, resp. rate 19, height 5\' 2"  (1.575 m), weight 205 lb (92.987 kg), last menstrual period 10/01/2015, SpO2 99 %.Body mass index is 37.49 kg/(m^2).  Repeat BP 145/90.  Pulse 85   General Appearance: Well Groomed  Eye Contact::  Good  Speech:  Normal Rate409  Volume:  Normal  Mood:  improved, minimizes depression   Affect:  Appropriate-  Reactive.  Thought Process:  Linear  Orientation:  Full (Time, Place, and Person)  Thought Content:  no hallucinations , no delusions   Suicidal Thoughts:  No denies any suicidal ideations , denies any self injurious ideations   Homicidal Thoughts:  No denies any homicidal ideations   Memory:  recent and remote  grossly intact   Judgement:  Other:  improved  Insight:  improved   Psychomotor Activity:  Normal  Concentration:  Good  Recall:  Good  Fund of Knowledge:Good  Language: Good  Akathisia:  Negative  Handed:  Right  AIMS (if indicated):     Assets:  Communication Skills Desire for Improvement Resilience  Sleep:  Number of Hours: 6.25  Cognition: WNL  ADL's:  Intact   Mental Status Per Nursing Assessment::   On Admission:      Demographic Factors:  40 year old female   Loss Factors: Relationship stressors   Historical Factors: History of depression ,   Risk Reduction Factors:   Positive coping skills or problem solving skills  Continued Clinical Symptoms:  At this time patient is improved compared to admission, presents with improved mood,fuller range of affect, no thought disorder, no SI, no HI, no psychotic symptoms   Cognitive Features That Contribute To Risk:  No gross cognitive deficits noted upon discharge. Is alert , attentive, and oriented x 3   Suicide Risk:  Mild:  Suicidal ideation of limited frequency, intensity, duration, and specificity.  There are no identifiable plans, no associated intent, mild dysphoria and related symptoms, good self-control (both objective and subjective assessment), few other risk factors, and identifiable protective factors, including available and accessible social support.  Follow-up Information    [facility]   Why:  Walk in within 2 days of your discharge between 8am-10am for assessment (medication management, individual counseling, substance abuse groups). Please social security card/ID).    Contact information:   [address] [place], [place] [address] Phone: [phone] Fax: [phone]      Plan Of Care/Follow-up recommendations:  Activity:  as tolerated  Diet:  heart healthy Tests:  NA Other:  see below  Patient is requesting discharge and there are no grounds for involuntary commitment She is leaving in good spirits  Follow up as above   Nehemiah MassedOBOS, Rashawn Rayman, MD 10/09/2015, 9:32 AM

## 2015-10-09 NOTE — Progress Notes (Signed)
  [facility] :  Will you be returning to the same living situation after discharge:  Yes,  home  At discharge, do you have transportation home?: Yes,  fiance Do you have the ability to pay for your medications: Yes,  mental health  Release of information consent forms completed and submitted to medical records by CSW.   Patient to Follow up at: Follow-up Information    [facility] with [facility].   Why:  Walk in within 2 days of your discharge between 8am-10am for assessment (medication management, individual counseling, substance abuse groups). Please social security card/ID).    Contact information:   [address] [place], [place] [address] Phone: [phone] Fax: [phone]      Next level of care provider has access to [facility] Link:yes  Safety Planning and Suicide Prevention discussed: Yes; contact attempts made with pt's fiance. SPI pamphlet and Mobile Crisis information provided to pt. She was encouraged to share information with support network, ask questions, and talk about any concerns relating to SPE.   Have you used any form of tobacco in the last 30 days? (Cigarettes, Smokeless Tobacco, Cigars, and/or Pipes): Yes  Has patient been referred to the Quitline?: Patient refused referral  Patient has been referred for addiction treatment: Yes  Smart, Ada Holness LCSW 10/09/2015, 9:34 AM

## 2017-05-23 ENCOUNTER — Ambulatory Visit (HOSPITAL_COMMUNITY)
Admission: RE | Admit: 2017-05-23 | Discharge: 2017-05-23 | Disposition: A | Discharge status: Home / Self Care | Payer: Self-pay | Attending: Psychiatry | Admitting: Psychiatry

## 2017-05-23 DIAGNOSIS — F331 Major depressive disorder, recurrent, moderate: Secondary | ICD-10-CM | POA: Insufficient documentation

## 2017-05-23 DIAGNOSIS — I10 Essential (primary) hypertension: Secondary | ICD-10-CM | POA: Insufficient documentation

## 2017-05-23 DIAGNOSIS — F4310 Post-traumatic stress disorder, unspecified: Secondary | ICD-10-CM | POA: Insufficient documentation

## 2017-05-23 DIAGNOSIS — Z1330 Encounter for screening examination for mental health and behavioral disorders, unspecified: Secondary | ICD-10-CM | POA: Insufficient documentation

## 2017-05-23 DIAGNOSIS — M797 Fibromyalgia: Secondary | ICD-10-CM | POA: Insufficient documentation
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
## 2017-07-30 ENCOUNTER — Emergency Department (HOSPITAL_COMMUNITY)
Admission: EM | Admit: 2017-07-30 | Discharge: 2017-07-30 | Disposition: A | Discharge status: Home / Self Care | Payer: No Typology Code available for payment source | Attending: Emergency Medicine | Admitting: Emergency Medicine

## 2017-07-30 ENCOUNTER — Other Ambulatory Visit: Payer: Self-pay

## 2017-07-30 ENCOUNTER — Emergency Department (HOSPITAL_COMMUNITY): Payer: No Typology Code available for payment source

## 2017-07-30 ENCOUNTER — Encounter (HOSPITAL_COMMUNITY): Payer: Self-pay | Admitting: Emergency Medicine

## 2017-07-30 DIAGNOSIS — F17210 Nicotine dependence, cigarettes, uncomplicated: Secondary | ICD-10-CM | POA: Insufficient documentation

## 2017-07-30 DIAGNOSIS — Y92410 Unspecified street and highway as the place of occurrence of the external cause: Secondary | ICD-10-CM | POA: Diagnosis not present

## 2017-07-30 DIAGNOSIS — E039 Hypothyroidism, unspecified: Secondary | ICD-10-CM | POA: Diagnosis not present

## 2017-07-30 DIAGNOSIS — F319 Bipolar disorder, unspecified: Secondary | ICD-10-CM | POA: Diagnosis not present

## 2017-07-30 DIAGNOSIS — R079 Chest pain, unspecified: Secondary | ICD-10-CM | POA: Diagnosis not present

## 2017-07-30 DIAGNOSIS — R1084 Generalized abdominal pain: Secondary | ICD-10-CM | POA: Diagnosis not present

## 2017-07-30 DIAGNOSIS — F1220 Cannabis dependence, uncomplicated: Secondary | ICD-10-CM | POA: Insufficient documentation

## 2017-07-30 DIAGNOSIS — Y998 Other external cause status: Secondary | ICD-10-CM | POA: Diagnosis not present

## 2017-07-30 DIAGNOSIS — M25521 Pain in right elbow: Secondary | ICD-10-CM | POA: Diagnosis not present

## 2017-07-30 DIAGNOSIS — S0990XA Unspecified injury of head, initial encounter: Secondary | ICD-10-CM | POA: Diagnosis present

## 2017-07-30 DIAGNOSIS — Y9389 Activity, other specified: Secondary | ICD-10-CM | POA: Diagnosis not present

## 2017-07-30 DIAGNOSIS — S060X0A Concussion without loss of consciousness, initial encounter: Secondary | ICD-10-CM | POA: Insufficient documentation

## 2017-07-30 DIAGNOSIS — F1120 Opioid dependence, uncomplicated: Secondary | ICD-10-CM | POA: Diagnosis not present

## 2017-07-30 DIAGNOSIS — R0789 Other chest pain: Secondary | ICD-10-CM

## 2017-07-30 DIAGNOSIS — S161XXA Strain of muscle, fascia and tendon at neck level, initial encounter: Secondary | ICD-10-CM

## 2017-07-30 LAB — RAPID URINE DRUG SCREEN, HOSP PERFORMED
Amphetamines: POSITIVE — AB
BARBITURATES: NOT DETECTED
BENZODIAZEPINES: POSITIVE — AB
COCAINE: POSITIVE — AB
Opiates: NOT DETECTED
Tetrahydrocannabinol: NOT DETECTED

## 2017-07-30 LAB — COMPREHENSIVE METABOLIC PANEL
ALBUMIN: 3.8 g/dL (ref 3.5–5.0)
ALT: 21 U/L (ref 14–54)
AST: 19 U/L (ref 15–41)
Alkaline Phosphatase: 65 U/L (ref 38–126)
Anion gap: 11 (ref 5–15)
BUN: 13 mg/dL (ref 6–20)
CHLORIDE: 104 mmol/L (ref 101–111)
CO2: 23 mmol/L (ref 22–32)
Calcium: 8.8 mg/dL — ABNORMAL LOW (ref 8.9–10.3)
Creatinine, Ser: 0.84 mg/dL (ref 0.44–1.00)
GFR calc Af Amer: >60 mL/min (ref >60)
Glucose, Bld: 174 mg/dL — ABNORMAL HIGH (ref 65–99)
POTASSIUM: 2.8 mmol/L — AB (ref 3.5–5.1)
SODIUM: 138 mmol/L (ref 135–145)
Total Bilirubin: 0.4 mg/dL (ref 0.3–1.2)
Total Protein: 7.1 g/dL (ref 6.5–8.1)

## 2017-07-30 LAB — URINALYSIS, ROUTINE W REFLEX MICROSCOPIC
BILIRUBIN URINE: NEGATIVE
Glucose, UA: NEGATIVE mg/dL
Hgb urine dipstick: NEGATIVE
Ketones, ur: NEGATIVE mg/dL
LEUKOCYTES UA: NEGATIVE
NITRITE: NEGATIVE
PH: 5 (ref 5.0–8.0)
Protein, ur: NEGATIVE mg/dL
SPECIFIC GRAVITY, URINE: 1.028 (ref 1.005–1.030)

## 2017-07-30 LAB — I-STAT BETA HCG BLOOD, ED (MC, WL, AP ONLY)

## 2017-07-30 LAB — CBC
HEMATOCRIT: 40.6 % (ref 36.0–46.0)
Hemoglobin: 13.3 g/dL (ref 12.0–15.0)
MCH: 28 pg (ref 26.0–34.0)
MCHC: 32.8 g/dL (ref 30.0–36.0)
MCV: 85.5 fL (ref 78.0–100.0)
Platelets: 219 10*3/uL (ref 150–400)
RBC: 4.75 MIL/uL (ref 3.87–5.11)
RDW: 16 % — AB (ref 11.5–15.5)
WBC: 11.2 10*3/uL — AB (ref 4.0–10.5)

## 2017-07-30 LAB — I-STAT CG4 LACTIC ACID, ED: Lactic Acid, Venous: 1.08 mmol/L (ref 0.5–1.9)

## 2017-07-30 LAB — POC URINE PREG, ED: Preg Test, Ur: NEGATIVE

## 2017-07-30 LAB — ETHANOL: Alcohol, Ethyl (B): <10 mg/dL (ref <10)

## 2017-07-30 MED ORDER — ONDANSETRON HCL 4 MG/5ML PO SOLN
4.0000 mg | Freq: Once | ORAL | Status: DC
  Filled 2017-07-30: qty 1

## 2017-07-30 MED ORDER — SODIUM CHLORIDE 0.9 % IV BOLUS
1000.0000 mL | Freq: Once | INTRAVENOUS | Status: AC
  Administered 2017-07-30: 1000 mL via INTRAVENOUS

## 2017-07-30 MED ORDER — IBUPROFEN 800 MG PO TABS: 800.0000 mg | ORAL_TABLET | Freq: Three times a day (TID) | ORAL | 0 refills | Status: AC | PRN

## 2017-07-30 MED ORDER — IOPAMIDOL (ISOVUE-300) INJECTION 61%
100.0000 mL | Freq: Once | INTRAVENOUS | Status: AC | PRN
  Administered 2017-07-30: 100 mL via INTRAVENOUS

## 2017-07-30 MED ORDER — FENTANYL CITRATE (PF) 100 MCG/2ML IJ SOLN
50.0000 ug | Freq: Once | INTRAMUSCULAR | Status: DC
  Filled 2017-07-30: qty 2

## 2017-07-30 NOTE — ED Notes (Signed)
Pt refused c-collar

## 2017-07-30 NOTE — ED Notes (Signed)
Medications held per Dr. Edwin DadaLong's verbal order.
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
## 2017-07-30 NOTE — ED Notes (Signed)
Change in patient's mental status, Dr. Jacqulyn BathLong informed, in room to see patient.

## 2017-07-30 NOTE — Discharge Instructions (Addendum)
You were seen in the Emergency Department (ED) today for a head injury.  Based on your evaluation, you may have sustained a concussion (or bruise) to your brain.  If you had a CT scan done, it did not show any evidence of serious injury or bleeding.    Symptoms to expect from a concussion include nausea, mild to moderate headache, difficulty concentrating or sleeping, and mild lightheadedness.  These symptoms should improve over the next few days to weeks, but it may take many weeks before you feel back to normal.  Return to the emergency department or follow-up with your primary care doctor if your symptoms are not improving over this time.  Signs of a more serious head injury include vomiting, severe headache, excessive sleepiness or confusion, and weakness or numbness in your face, arms or legs.  Return immediately to the Emergency Department if you experience any of these more concerning symptoms.    Substance Abuse Treatment Programs  Intensive Outpatient Programs [facility]     [address]      [place], [place]                   [phone]       [facility] [address] [place], [place] [phone]  [facility]     (Inpatient and outpatient)     [address]           [phone]    [facility] [phone] (Suboxone and Methadone)  [address]      [place], [place] [address]      [phone]       [address] [place], [place] [phone]  [facility] (Outpatient/Inpatient, Chemical)    (insurance only) [phone]             [facility] (Groups & Residential) [place], [place] [phone]     [facility]     [address]     [place], [place]      [phone]       [facility] (for caregivers and family) [phone] [address] Laurell Josephs[address] [place], [place] [phone]      Residential Treatment Programs [facility]      [address] [place], [place] [address]  [phone]       [facility] [address], [place], [place] [address] [phone]  [facility] [place]        [phone]       [facility] [phone]  [facility] ([facility]             [address]                                         [place], [place]                                                [phone] or [phone]                               [facility] [place] [address] [place], [address] [phone]  [facility]  [address]      [place], [place]     [phone]       [facility] [address] [place], [place] [phone]  [facility]   [address]     [place], [place] [address]     [phone]      Admissions: 8am-3pm M-F  [facility] ([facility]) [address] [place], [place] [phone]  [facility]: Residential Program [phone](90 Days)   [place], [place]      [phone] or [phone]     [facility]: [facility] [place], [place] (Walk in Hours over the weekend or by referral)  [facility] [address] [place], [place], [place] [address] [phone]  Crisis Mobile: [facility]:  [phone] (for crisis response 24 hours a day) [facility] Hotline:      [phone] Outpatient Psychiatry and Counseling  [facility]: Mobile Crisis Management 24 hours:  [phone]  [facility] sliding scale fee and walk in schedule: M-F 8am-12pm/1pm-3pm [address]  [place], [place] [id number] [phone]  [facility] [address] [place], [place] [address] [phone]  [facility] ([facility] new patient walk-in appointments available Monday - Friday 8am -3pm.          [address] [place], [place] [address] [phone] or crisis line-  [phone]  [facility]/ Intensive Outpatient Therapy Program [address] [place], [place] [address] [phone]  [facility]                  Crisis Services      [phone]      [address]     [place], [place] [address]                 [facility]   [facility] [phone] [address] [place], [place] [address]   [facility]          [address] [place] [address] [place],  [place], [place] [address]       [phone]  [facility] [address] [place], [place] [address] [phone]  [facility]    [address]    [place], [place] [address]     [phone]       Andee PolesParish McKinney, MD     [address]     [place] [address]     [phone]       [facility] [address] [place] [address]  [facility]     [address]     [place], [place]      [phone]       [facility] Eulogio DitchShamsher Ahluwalia, MD [address] [place], [place] [address] [phone]  [facility]     [address]     [place], [place] [address]     [phone]       [facility] [address] [place], [place] [address] [phone] Resources for Temporary Residential Assistance/Crisis Centers  [facility]) M-F 8am-3pm   [address] [place], [place] [address]   [phone] Services include: laundry, barbering, support groups, case management, phone  & computer access, showers, AA/NA mtgs, mental health/substance abuse nurse, job skills class, disability information, VA assistance,  spiritual classes, etc.   HOMELESS SHELTERS  [facility]     [facility]   [address], [place]     [phone]              [employer] (women and  children)       [address] [place], [place] [address] [phone] [email or web address] for application and process Application Required  [facility]   [address]    [place] [address]     [phone]                    [facility] [address] [place], [place] [address] [phone] application appt.) Application Required  [facility] (women only)    [address]     [place], [place] [address]     [phone]      Intake starts 6pm daily Need valid ID, SSC, & Police report [employer] [address] [place], [place] [phone] Application Required  [employer] (men only)     [address]      [place], [place]     [phone]       [facility] (Pregnant women only) [address] [place], [place] [phone]  [facility]      [address]      [place], [place] [address]     [phone]             [facility] [address] [place], [place] [phone] 90 day commitment/SA/Application process  [facility](men only)     [address]     [place], [place]     [phone]       Check-in at [facility] [address] [place], [place] [address] [phone] Men/Women/Women and Children must be there by 7 pm  [address] [place], [place] [phone]                  Rest, avoid strenuous physical or mental activity, and avoid activities that could potentially result in another head injury until all your symptoms from this head injury are completely resolved for at least 2-3 weeks.  If you participate in sports, get cleared by your doctor or trainer before returning to play.  You may take ibuprofen or acetaminophen over the counter according to label instructions for mild headache or scalp soreness.

## 2018-02-23 ENCOUNTER — Inpatient Hospital Stay: Admit: 2018-02-23 | Discharge: 2018-02-23 | Attending: Emergency Medicine

## 2018-02-23 NOTE — ED Notes (Signed)
Still have not been able to locate.

## 2018-02-23 NOTE — ED Notes (Signed)
Still have not been able to locate.

## 2021-11-06 DEATH — deceased
